# Patient Record
Sex: Male | Born: 1970 | Race: Black or African American | Hispanic: No | Marital: Married | Smoking: Never smoker
Health system: Southern US, Community
[De-identification: ages and names within clinical notes are randomized; demographics above are authoritative.]

## PROBLEM LIST (undated history)

## (undated) DIAGNOSIS — IMO0002 Reserved for concepts with insufficient information to code with codable children: Secondary | ICD-10-CM

## (undated) HISTORY — DX: Reserved for concepts with insufficient information to code with codable children: IMO0002

---

## 2011-01-14 ENCOUNTER — Emergency Department (HOSPITAL_COMMUNITY): Payer: BC Managed Care – PPO

## 2011-01-14 ENCOUNTER — Emergency Department (HOSPITAL_COMMUNITY)
Admission: EM | Admit: 2011-01-14 | Discharge: 2011-01-14 | Disposition: A | Discharge status: Home / Self Care | Payer: BC Managed Care – PPO | Attending: Emergency Medicine | Admitting: Emergency Medicine

## 2011-01-14 DIAGNOSIS — R109 Unspecified abdominal pain: Secondary | ICD-10-CM | POA: Insufficient documentation

## 2011-01-14 DIAGNOSIS — N201 Calculus of ureter: Secondary | ICD-10-CM | POA: Insufficient documentation

## 2011-01-14 DIAGNOSIS — Z87442 Personal history of urinary calculi: Secondary | ICD-10-CM | POA: Insufficient documentation

## 2011-01-14 DIAGNOSIS — M549 Dorsalgia, unspecified: Secondary | ICD-10-CM | POA: Insufficient documentation

## 2011-01-14 DIAGNOSIS — N1330 Unspecified hydronephrosis: Secondary | ICD-10-CM | POA: Insufficient documentation

## 2011-01-14 LAB — URINALYSIS, ROUTINE W REFLEX MICROSCOPIC
Ketones, ur: NEGATIVE mg/dL
Nitrite: NEGATIVE
Specific Gravity, Urine: 1.024 (ref 1.005–1.030)
pH: 7 (ref 5.0–8.0)

## 2011-01-14 LAB — URINE MICROSCOPIC-ADD ON

## 2016-06-20 ENCOUNTER — Encounter: Payer: Self-pay | Admitting: Family Medicine

## 2016-06-20 ENCOUNTER — Encounter (INDEPENDENT_AMBULATORY_CARE_PROVIDER_SITE_OTHER): Payer: Self-pay

## 2016-06-20 ENCOUNTER — Ambulatory Visit (INDEPENDENT_AMBULATORY_CARE_PROVIDER_SITE_OTHER): Payer: BLUE CROSS/BLUE SHIELD | Admitting: Family Medicine

## 2016-06-20 VITALS — BP 112/64 | HR 62 | Temp 98.1°F | Ht 67.0 in | Wt 176.0 lb

## 2016-06-20 DIAGNOSIS — I861 Scrotal varices: Secondary | ICD-10-CM

## 2016-06-20 DIAGNOSIS — N411 Chronic prostatitis: Secondary | ICD-10-CM

## 2016-06-20 DIAGNOSIS — Z0000 Encounter for general adult medical examination without abnormal findings: Secondary | ICD-10-CM

## 2016-06-20 DIAGNOSIS — N410 Acute prostatitis: Secondary | ICD-10-CM

## 2016-06-20 LAB — CBC WITH DIFFERENTIAL/PLATELET
BASOS ABS: 0 10*3/uL (ref 0.0–0.2)
Basos: 0 %
EOS (ABSOLUTE): 0 10*3/uL (ref 0.0–0.4)
Eos: 1 %
Hematocrit: 45.8 % (ref 37.5–51.0)
Hemoglobin: 15.5 g/dL (ref 13.0–17.7)
IMMATURE GRANULOCYTES: 0 %
Immature Grans (Abs): 0 10*3/uL (ref 0.0–0.1)
LYMPHS ABS: 2.3 10*3/uL (ref 0.7–3.1)
Lymphs: 39 %
MCH: 30.6 pg (ref 26.6–33.0)
MCHC: 33.8 g/dL (ref 31.5–35.7)
MCV: 90 fL (ref 79–97)
MONOCYTES: 8 %
Monocytes Absolute: 0.5 10*3/uL (ref 0.1–0.9)
NEUTROS PCT: 52 %
Neutrophils Absolute: 3.1 10*3/uL (ref 1.4–7.0)
Platelets: 213 10*3/uL (ref 150–379)
RBC: 5.07 x10E6/uL (ref 4.14–5.80)
RDW: 13.4 % (ref 12.3–15.4)
WBC: 6 10*3/uL (ref 3.4–10.8)

## 2016-06-20 LAB — URINALYSIS
Bilirubin, UA: NEGATIVE
GLUCOSE, UA: NEGATIVE
Ketones, UA: NEGATIVE
LEUKOCYTES UA: NEGATIVE
Nitrite, UA: NEGATIVE
PROTEIN UA: NEGATIVE
RBC, UA: NEGATIVE
Specific Gravity, UA: 1.02 (ref 1.005–1.030)
Urobilinogen, Ur: 0.2 mg/dL (ref 0.2–1.0)
pH, UA: 6.5 (ref 5.0–7.5)

## 2016-06-20 LAB — CMP14+EGFR
ALK PHOS: 59 IU/L (ref 39–117)
ALT: 16 IU/L (ref 0–44)
AST: 18 IU/L (ref 0–40)
Albumin/Globulin Ratio: 2 (ref 1.2–2.2)
Albumin: 4.3 g/dL (ref 3.5–5.5)
BUN/Creatinine Ratio: 11 (ref 9–20)
BUN: 10 mg/dL (ref 6–24)
Bilirubin Total: 1.1 mg/dL (ref 0.0–1.2)
CALCIUM: 9.3 mg/dL (ref 8.7–10.2)
CO2: 25 mmol/L (ref 18–29)
CREATININE: 0.87 mg/dL (ref 0.76–1.27)
Chloride: 102 mmol/L (ref 96–106)
GFR calc Af Amer: 122 mL/min/{1.73_m2} (ref >59)
GFR calc non Af Amer: 106 mL/min/{1.73_m2} (ref >59)
GLUCOSE: 86 mg/dL (ref 65–99)
Globulin, Total: 2.1 g/dL (ref 1.5–4.5)
Potassium: 4 mmol/L (ref 3.5–5.2)
Sodium: 142 mmol/L (ref 134–144)
Total Protein: 6.4 g/dL (ref 6.0–8.5)

## 2016-06-20 LAB — LIPID PANEL
CHOLESTEROL TOTAL: 201 mg/dL — AB (ref 100–199)
Chol/HDL Ratio: 3.3 ratio units (ref 0.0–5.0)
HDL: 61 mg/dL (ref >39)
LDL CALC: 127 mg/dL — AB (ref 0–99)
TRIGLYCERIDES: 67 mg/dL (ref 0–149)
VLDL CHOLESTEROL CAL: 13 mg/dL (ref 5–40)

## 2016-06-20 MED ORDER — TRAZODONE HCL 150 MG PO TABS: ORAL_TABLET | ORAL | 5 refills | Status: DC

## 2016-06-20 MED ORDER — CIPROFLOXACIN HCL 500 MG PO TABS: 500.0000 mg | ORAL_TABLET | Freq: Two times a day (BID) | ORAL | 0 refills | Status: DC

## 2016-06-20 NOTE — Progress Notes (Signed)
**Note Vincent-Identified via Obfuscation** Subjective:  Patient ID: Vincent Sweeney, male    DOB: 06/06/1970  Age: 46 y.o. MRN: [id number]  CC: New Patient (Initial Visit) (pt here today to establish care, CPE and having trouble sleeping more than 4 hours.)   HPI Vincent Sweeney presents for CPE. Having some scrotal pain. Discomfort, aching. Ongoing for several weeks. Left testicle affected most. Was told in the past he had a varicocele. Repair had been offered, but he delayed. Wants to consider this time   History Vincent Sweeney has a past medical history of Ulcer ([place]).   He has no past surgical history on file.   His family history includes Arthritis in his mother; COPD in his mother; Hypertension in his mother.He reports that he has never smoked. He has never used smokeless tobacco. He reports that he drinks alcohol. He reports that he does not use drugs.    ROS Review of Systems  Constitutional: Negative for chills, diaphoresis, fever and unexpected weight change.  HENT: Negative for congestion, hearing loss, rhinorrhea and sore throat.   Eyes: Negative for visual disturbance.  Respiratory: Negative for cough and shortness of breath.   Cardiovascular: Negative for chest pain.  Gastrointestinal: Negative for abdominal pain, constipation and diarrhea.  Genitourinary: Negative for dysuria and flank pain.  Musculoskeletal: Negative for arthralgias and joint swelling.  Skin: Negative for rash.  Neurological: Negative for dizziness and headaches.  Psychiatric/Behavioral: Negative for dysphoric mood and sleep disturbance.    Objective:  BP 112/64   Pulse 62   Temp 98.1 F (36.7 C) (Oral)   Ht 5' 7"  (1.702 m)   Wt 176 lb (79.8 kg)   BMI 27.57 kg/m   BP Readings from Last 3 Encounters:  06/20/16 112/64    Wt Readings from Last 3 Encounters:  06/20/16 176 lb (79.8 kg)     Physical Exam  Constitutional: He is oriented to person, place, and time. He appears well-developed and well-nourished. No distress.  HENT:  Head:  Normocephalic and atraumatic.  Right Ear: External ear normal.  Left Ear: External ear normal.  Nose: Nose normal.  Mouth/Throat: Oropharynx is clear and moist.  Eyes: Conjunctivae and EOM are normal. Pupils are equal, round, and reactive to light.  Neck: Normal range of motion. Neck supple. No thyromegaly present.  Cardiovascular: Normal rate, regular rhythm and normal heart sounds.   No murmur heard. Pulmonary/Chest: Effort normal and breath sounds normal. No respiratory distress. He has no wheezes. He has no rales.  Abdominal: Soft. Bowel sounds are normal. He exhibits no distension. There is no tenderness. Hernia confirmed negative in the right inguinal area and confirmed negative in the left inguinal area.  Genitourinary: Rectum normal and penis normal. Prostate is tender (& boggy). Right testis shows no mass. Left testis shows tenderness. Left testis shows no mass.  Lymphadenopathy:    He has no cervical adenopathy.       Right: No inguinal adenopathy present.       Left: No inguinal adenopathy present.  Neurological: He is alert and oriented to person, place, and time. He has normal reflexes.  Skin: Skin is warm and dry.  Psychiatric: He has a normal mood and affect. His behavior is normal. Judgment and thought content normal.    Ct Abdomen Pelvis Wo Contrast  Result Date: 01/14/2011 *RADIOLOGY REPORT* Clinical Data: Right flank pain. CT ABDOMEN AND PELVIS WITHOUT CONTRAST Technique:  Multidetector CT imaging of the abdomen and pelvis was performed following the standard protocol without intravenous contrast. Comparison: None.  Findings:  3.2 mm right ureteral vesicle junction stone with mild right-sided hydronephrosis. Unenhanced imaging without focal liver, splenic, renal, adrenal or pancreatic mass.  No calcified gallstones.  No abdominal aortic aneurysm.  No extraluminal bowel inflammatory process.  No free fluid or free air.  No pelvic or abdominal adenopathy.  No bony destructive  lesion. IMPRESSION: 3.2  mm right ureteral vesicle junction stone with mild right-sided hydronephrosis. Original Report Authenticated By: Doug Sou, M.D.   Assessment & Plan:   Vincent Sweeney was seen today for new patient (initial visit).  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 For prostate. Take all of these. -     traZODone (DESYREL) 150 MG tablet; Use from 1/3 to 1 tablet nightly as needed for sleep.      I am having Mr. Appenzeller start on ciprofloxacin and traZODone.  Allergies as of 06/20/2016   No Known Allergies     Medication List       Accurate as of 06/20/16 11:59 PM. Always use your most recent med list.          ciprofloxacin 500 MG tablet Commonly known as:  CIPRO Take 1 tablet (500 mg total) by mouth 2 (two) times daily. For prostate. Take all of these.   traZODone 150 MG tablet Commonly known as:  DESYREL Use from 1/3 to 1 tablet nightly as needed for sleep.        Follow-up: No Follow-up on file.  Claretta Fraise, M.D.

## 2016-06-25 ENCOUNTER — Other Ambulatory Visit: Payer: Self-pay | Admitting: Family Medicine

## 2016-06-25 DIAGNOSIS — N5082 Scrotal pain: Secondary | ICD-10-CM

## 2016-06-30 ENCOUNTER — Other Ambulatory Visit: Payer: Self-pay | Admitting: Family Medicine

## 2016-06-30 DIAGNOSIS — I861 Scrotal varices: Secondary | ICD-10-CM

## 2016-06-30 DIAGNOSIS — N5082 Scrotal pain: Secondary | ICD-10-CM

## 2016-07-01 ENCOUNTER — Other Ambulatory Visit: Payer: Self-pay

## 2016-07-01 ENCOUNTER — Ambulatory Visit (HOSPITAL_COMMUNITY): Payer: Self-pay

## 2016-07-04 ENCOUNTER — Ambulatory Visit (HOSPITAL_BASED_OUTPATIENT_CLINIC_OR_DEPARTMENT_OTHER): Admission: RE | Admit: 2016-07-04 | Payer: BLUE CROSS/BLUE SHIELD | Source: Ambulatory Visit

## 2016-07-04 ENCOUNTER — Ambulatory Visit (HOSPITAL_BASED_OUTPATIENT_CLINIC_OR_DEPARTMENT_OTHER)
Admission: RE | Admit: 2016-07-04 | Discharge: 2016-07-04 | Disposition: A | Discharge status: Home / Self Care | Payer: BLUE CROSS/BLUE SHIELD | Source: Ambulatory Visit | Attending: Family Medicine | Admitting: Family Medicine

## 2016-07-04 ENCOUNTER — Other Ambulatory Visit: Payer: Self-pay | Admitting: Family Medicine

## 2016-07-04 ENCOUNTER — Other Ambulatory Visit (HOSPITAL_BASED_OUTPATIENT_CLINIC_OR_DEPARTMENT_OTHER): Payer: Self-pay

## 2016-07-04 DIAGNOSIS — N433 Hydrocele, unspecified: Secondary | ICD-10-CM | POA: Insufficient documentation

## 2016-07-04 DIAGNOSIS — N4341 Spermatocele of epididymis, single: Secondary | ICD-10-CM | POA: Insufficient documentation

## 2016-07-04 DIAGNOSIS — N5082 Scrotal pain: Secondary | ICD-10-CM | POA: Diagnosis not present

## 2016-07-05 ENCOUNTER — Telehealth: Payer: Self-pay | Admitting: Family Medicine

## 2016-07-05 NOTE — Telephone Encounter (Signed)
Pt is returning call this morning about U/S results this morning. Please advise

## 2016-07-05 NOTE — Telephone Encounter (Signed)
Pt aware of US results. 

## 2016-07-11 ENCOUNTER — Encounter: Payer: Self-pay | Admitting: Family Medicine

## 2016-07-11 ENCOUNTER — Ambulatory Visit (INDEPENDENT_AMBULATORY_CARE_PROVIDER_SITE_OTHER): Payer: BLUE CROSS/BLUE SHIELD | Admitting: Family Medicine

## 2016-07-11 VITALS — BP 105/57 | HR 58 | Temp 97.6°F | Ht 67.0 in | Wt 177.0 lb

## 2016-07-11 DIAGNOSIS — N433 Hydrocele, unspecified: Secondary | ICD-10-CM | POA: Diagnosis not present

## 2016-07-11 DIAGNOSIS — B3789 Other sites of candidiasis: Secondary | ICD-10-CM

## 2016-07-11 DIAGNOSIS — N411 Chronic prostatitis: Secondary | ICD-10-CM | POA: Diagnosis not present

## 2016-07-11 DIAGNOSIS — N410 Acute prostatitis: Secondary | ICD-10-CM | POA: Diagnosis not present

## 2016-07-11 DIAGNOSIS — N4281 Prostatodynia syndrome: Secondary | ICD-10-CM | POA: Diagnosis not present

## 2016-07-11 MED ORDER — ECONAZOLE NITRATE 1 % EX CREA: TOPICAL_CREAM | Freq: Every day | CUTANEOUS | 0 refills | Status: DC

## 2016-07-11 MED ORDER — CIPROFLOXACIN HCL 500 MG PO TABS: 500.0000 mg | ORAL_TABLET | Freq: Two times a day (BID) | ORAL | 0 refills | Status: DC

## 2016-07-11 NOTE — Progress Notes (Signed)
Subjective:  Patient ID: Vincent Sweeney, male    DOB: 10-09-1970  Age: 46 y.o. MRN: [id number]  CC: Follow-up (pt here today stating he is still having discomfort and just finished the cipro.)   HPI Ward Boissonneault presents for persistent pain at groin. Unchanged. Radiates to left testicle. Moderate. Subacute. Ultrasound reviewed. No sign of torsion. He does have hydrocele   History Dagon has a past medical history of Ulcer ([facility]).   He has no past surgical history on file.   His family history includes Arthritis in his mother; COPD in his mother; Hypertension in his mother.He reports that he has never smoked. He has never used smokeless tobacco. He reports that he drinks alcohol. He reports that he does not use drugs.    ROS Review of Systems  Constitutional: Negative for chills, diaphoresis and fever.  HENT: Negative for rhinorrhea and sore throat.   Respiratory: Negative for cough and shortness of breath.   Cardiovascular: Negative for chest pain.  Gastrointestinal: Negative for abdominal pain, nausea and rectal pain.  Genitourinary: Positive for testicular pain. Negative for difficulty urinating, dysuria and flank pain.  Musculoskeletal: Negative for arthralgias and myalgias.  Skin: Negative for rash.       Perianal itching   Neurological: Negative for weakness and headaches.    Objective:  BP (!) 105/57   Pulse (!) 58   Temp 97.6 F (36.4 C) (Oral)   Ht 5\' 7"  (1.702 m)   Wt 177 lb (80.3 kg)   BMI 27.72 kg/m   BP Readings from Last 3 Encounters:  07/11/16 (!) 105/57  06/20/16 112/64    Wt Readings from Last 3 Encounters:  07/11/16 177 lb (80.3 kg)  06/20/16 176 lb (79.8 kg)     Physical Exam  Constitutional: He appears well-developed and well-nourished.  HENT:  Head: Normocephalic and atraumatic.  Right Ear: Tympanic membrane and external ear normal. No decreased hearing is noted.  Left Ear: Tympanic membrane and external ear normal. No decreased  hearing is noted.  Mouth/Throat: No oropharyngeal exudate or posterior oropharyngeal erythema.  Eyes: Pupils are equal, round, and reactive to light.  Neck: Normal range of motion. Neck supple.  Cardiovascular: Normal rate and regular rhythm.   No murmur heard. Pulmonary/Chest: Breath sounds normal. No respiratory distress.  Abdominal: Soft. Bowel sounds are normal. He exhibits no mass. There is no tenderness. Hernia confirmed negative in the right inguinal area and confirmed negative in the left inguinal area.  Genitourinary: Penis normal. Prostate is enlarged and tender. Left testis shows tenderness. Left testis shows no swelling. No penile tenderness.  Genitourinary Comments: 3 cm perianal erythema, slight nodularity with satellite   Vitals reviewed.     Assessment & Plan:   Markos was seen today for follow-up.  Diagnoses and all orders for this visit:  Prostadynia -     Ambulatory referral to Urology  Hydrocele of testis -     Ambulatory referral to Urology  Acute on chronic prostatitis  Candidiasis of anus  Other orders -     econazole nitrate 1 % cream; Apply topically daily. -     ciprofloxacin (CIPRO) 500 MG tablet; Take 1 tablet (500 mg total) by mouth 2 (two) times daily. For prostate. Take all of these.       I am having Mr. Sliker start on econazole nitrate. I am also having him maintain his traZODone and ciprofloxacin.  Allergies as of 07/11/2016   No Known Allergies     Medication  List       Accurate as of 07/11/16 11:59 PM. Always use your most recent med list.          ciprofloxacin 500 MG tablet Commonly known as:  CIPRO Take 1 tablet (500 mg total) by mouth 2 (two) times daily. For prostate. Take all of these.   econazole nitrate 1 % cream Apply topically daily.   traZODone 150 MG tablet Commonly known as:  DESYREL Use from 1/3 to 1 tablet nightly as needed for sleep.        Follow-up: Return in about 6 months (around 01/11/2017), or  if symptoms worsen or fail to improve.  Mechele ClaudeWarren Erline Siddoway, M.D.

## 2017-04-21 ENCOUNTER — Other Ambulatory Visit: Payer: Self-pay | Admitting: Occupational Medicine

## 2017-04-21 ENCOUNTER — Ambulatory Visit: Payer: Self-pay

## 2017-04-21 DIAGNOSIS — M545 Low back pain, unspecified: Secondary | ICD-10-CM

## 2017-04-21 DIAGNOSIS — M25572 Pain in left ankle and joints of left foot: Secondary | ICD-10-CM

## 2017-04-21 DIAGNOSIS — M546 Pain in thoracic spine: Secondary | ICD-10-CM

## 2018-06-23 ENCOUNTER — Encounter: Payer: Self-pay | Admitting: Family Medicine

## 2018-06-23 ENCOUNTER — Ambulatory Visit (INDEPENDENT_AMBULATORY_CARE_PROVIDER_SITE_OTHER): Payer: BLUE CROSS/BLUE SHIELD | Admitting: Family Medicine

## 2018-06-23 VITALS — BP 107/71 | HR 60 | Temp 98.5°F | Ht 67.0 in | Wt 180.0 lb

## 2018-06-23 DIAGNOSIS — M546 Pain in thoracic spine: Secondary | ICD-10-CM

## 2018-06-23 DIAGNOSIS — G8929 Other chronic pain: Secondary | ICD-10-CM

## 2018-06-23 DIAGNOSIS — Z0001 Encounter for general adult medical examination with abnormal findings: Secondary | ICD-10-CM | POA: Diagnosis not present

## 2018-06-23 DIAGNOSIS — N4281 Prostatodynia syndrome: Secondary | ICD-10-CM | POA: Insufficient documentation

## 2018-06-23 DIAGNOSIS — Z0000 Encounter for general adult medical examination without abnormal findings: Secondary | ICD-10-CM

## 2018-06-23 NOTE — Progress Notes (Signed)
Subjective:  Patient ID: Vincent Sweeney, male    DOB: 19-Jun-1970  Age: 48 y.o. MRN: [id number]  CC: Annual Exam   HPI Vincent Sweeney presents for Annual physical. Needs referral for Functional capacity evaluation. This is because of a work related six foot fall off of a platform several months ago. He was given a 10 lb lifting limit at a previous FCE. Overhead lifting  Is prohibited as well.   Depression screen [facility] 2/9 06/23/2018 07/11/2016 06/20/2016  Decreased Interest 0 0 0  Down, Depressed, Hopeless 0 0 0  PHQ - 2 Score 0 0 0    History Kelcey has a past medical history of Ulcer.   He has no past surgical history on file.   His family history includes Arthritis in his mother; COPD in his mother; Hypertension in his mother.He reports that he has never smoked. He has never used smokeless tobacco. He reports current alcohol use. He reports that he does not use drugs.    ROS Review of Systems  Constitutional: Negative for activity change, fatigue and unexpected weight change.  HENT: Negative for congestion, ear pain, hearing loss, postnasal drip and trouble swallowing.   Eyes: Negative for pain and visual disturbance.  Respiratory: Negative for cough, chest tightness and shortness of breath.   Cardiovascular: Negative for chest pain, palpitations and leg swelling.  Gastrointestinal: Negative for abdominal distention, abdominal pain, blood in stool, constipation, diarrhea, nausea and vomiting.  Endocrine: Negative for cold intolerance, heat intolerance and polydipsia.  Genitourinary: Negative for difficulty urinating, dysuria, flank pain, frequency and urgency.  Musculoskeletal: Positive for back pain (left flank region). Negative for arthralgias and joint swelling.  Skin: Negative for color change, rash and wound.  Neurological: Negative for dizziness, syncope, speech difficulty, weakness, light-headedness, numbness and headaches.  Hematological: Does not bruise/bleed easily.    Psychiatric/Behavioral: Negative for confusion, decreased concentration, dysphoric mood and sleep disturbance. The patient is not nervous/anxious.     Objective:  BP 107/71   Pulse 60   Temp 98.5 F (36.9 C) (Oral)   Ht 5' 7"  (1.702 m)   Wt 180 lb (81.6 kg)   BMI 28.19 kg/m   BP Readings from Last 3 Encounters:  06/23/18 107/71  07/11/16 (!) 105/57  06/20/16 112/64    Wt Readings from Last 3 Encounters:  06/23/18 180 lb (81.6 kg)  07/11/16 177 lb (80.3 kg)  06/20/16 176 lb (79.8 kg)     Physical Exam Constitutional:      Appearance: He is well-developed.  HENT:     Head: Normocephalic and atraumatic.  Eyes:     Pupils: Pupils are equal, round, and reactive to light.  Neck:     Musculoskeletal: Normal range of motion.     Thyroid: No thyromegaly.     Trachea: No tracheal deviation.  Cardiovascular:     Rate and Rhythm: Normal rate and regular rhythm.     Heart sounds: Normal heart sounds. No murmur. No friction rub. No gallop.   Pulmonary:     Breath sounds: Normal breath sounds. No wheezing or rales.  Abdominal:     General: Bowel sounds are normal. There is no distension.     Palpations: Abdomen is soft. There is no mass.     Tenderness: There is no abdominal tenderness.     Hernia: There is no hernia in the right inguinal area or left inguinal area.  Genitourinary:    Penis: Normal.      Scrotum/Testes: Normal.  Musculoskeletal:  Normal range of motion.  Lymphadenopathy:     Cervical: No cervical adenopathy.  Skin:    General: Skin is warm and dry.  Neurological:     Mental Status: He is alert and oriented to person, place, and time.       Assessment & Plan:   Naphtali was seen today for annual exam.  Diagnoses and all orders for this visit:  Annual physical exam -     CBC with Differential/Platelet -     CMP14+EGFR -     Lipid panel -     Testosterone,Free and Total -     VITAMIN D 25 Hydroxy (Vit-D Deficiency, Fractures) -     PSA, total  and free -     Urinalysis  Chronic left-sided thoracic back pain -     Ambulatory referral to Occupational Medicine       I have discontinued Jamiah Wakeman's econazole nitrate and ciprofloxacin. I am also having him maintain his traZODone.  Allergies as of 06/23/2018   No Known Allergies     Medication List       Accurate as of June 23, 2018  9:50 PM. Always use your most recent med list.        traZODone 150 MG tablet Commonly known as:  DESYREL Use from 1/3 to 1 tablet nightly as needed for sleep.        Follow-up: Return in about 1 year (around 06/24/2019), or if symptoms worsen or fail to improve.  Claretta Fraise, M.D.

## 2018-06-28 ENCOUNTER — Telehealth: Payer: Self-pay | Admitting: *Deleted

## 2018-06-28 NOTE — Telephone Encounter (Signed)
-----   Message from Mechele Claude, MD sent at 06/28/2018 11:35 AM EST ----- Please call and encourage pt. To return for his lab draw. Thanks, WS ----- Message ----- From: SYSTEM Sent: 06/28/2018  12:04 AM EST To: Mechele Claude, MD

## 2018-06-28 NOTE — Progress Notes (Signed)
Lm-cb 2/24 

## 2018-06-28 NOTE — Telephone Encounter (Signed)
Left message reminding patient to come in and have blood drawn

## 2018-07-13 ENCOUNTER — Telehealth: Payer: Self-pay | Admitting: Family Medicine

## 2018-07-13 NOTE — Telephone Encounter (Signed)
Spoke with pt and advised pt he needs to come in and have his blood work drawn. Pt states he will try to come in one day this week.  Pt also has questions regarding referral, Debbi/Carlon do you know anything about this?

## 2018-07-14 NOTE — Telephone Encounter (Signed)
Yes I called and left him a message that his referral was sent to occupational health at [facility] and I left the phone # so he could call on his VM

## 2018-07-26 ENCOUNTER — Other Ambulatory Visit: Payer: Self-pay

## 2018-07-26 DIAGNOSIS — M546 Pain in thoracic spine: Secondary | ICD-10-CM

## 2018-12-28 ENCOUNTER — Ambulatory Visit (INDEPENDENT_AMBULATORY_CARE_PROVIDER_SITE_OTHER): Payer: BC Managed Care – PPO | Admitting: Nurse Practitioner

## 2018-12-28 ENCOUNTER — Encounter: Payer: Self-pay | Admitting: Nurse Practitioner

## 2018-12-28 DIAGNOSIS — J301 Allergic rhinitis due to pollen: Secondary | ICD-10-CM

## 2018-12-28 MED ORDER — FLUTICASONE PROPIONATE 50 MCG/ACT NA SUSP: 2.0000 | Freq: Every day | NASAL | 6 refills | Status: DC

## 2018-12-28 NOTE — Progress Notes (Signed)
   Virtual Visit via telephone Note Due to COVID-19 pandemic this visit was conducted virtually. This visit type was conducted due to national recommendations for restrictions regarding the COVID-19 Pandemic (e.g. social distancing, sheltering in place) in an effort to limit this patient's exposure and mitigate transmission in our community. All issues noted in this document were discussed and addressed.  A physical exam was not performed with this format.  I connected with Vincent Sweeney on 12/28/18 at 2:35 by telephone and verified that I am speaking with the correct person using two identifiers. Vincent Sweeney is currently located at home and no one is currently with him during visit. The provider, Mary-Margaret Hassell Done, FNP is located in their office at time of visit.  I discussed the limitations, risks, security and privacy concerns of performing an evaluation and management service by telephone and the availability of in person appointments. I also discussed with the patient that there may be a patient responsible charge related to this service. The patient expressed understanding and agreed to proceed.   History and Present Illness:   Chief Complaint: Allergic Rhinitis    HPI Patient calls in c/o sinus congestion and runny nose with slight sore throat.started about 1 week ago. He is having no headache.he has tested covid X2    Review of Systems  Constitutional: Negative for diaphoresis and weight loss.  HENT: Positive for congestion. Negative for sore throat.   Eyes: Negative for blurred vision, double vision and pain.  Respiratory: Negative for cough and shortness of breath.   Cardiovascular: Negative for chest pain, palpitations, orthopnea and leg swelling.  Gastrointestinal: Negative for abdominal pain.  Skin: Negative for rash.  Neurological: Negative for dizziness, sensory change, loss of consciousness, weakness and headaches.  Endo/Heme/Allergies: Negative for polydipsia. Does  not bruise/bleed easily.  Psychiatric/Behavioral: Negative for memory loss. The patient does not have insomnia.   All other systems reviewed and are negative.    Observations/Objective: Alert and oriented- answers all questions appropriately No distress  Assessment and Plan: Vincent Sweeney in today with chief complaint of Allergic Rhinitis    1. Seasonal allergic rhinitis due to pollen Force fluids Rest claritin Meds ordered this encounter  Medications  . fluticasone (FLONASE) 50 MCG/ACT nasal spray    Sig: Place 2 sprays into both nostrils daily.    Dispense:  16 g    Refill:  6    Order Specific Question:   Supervising Provider    Answer:   Caryl Pina A [[id number]]      Follow Up Instructions: prn    I discussed the assessment and treatment plan with the patient. The patient was provided an opportunity to ask questions and all were answered. The patient agreed with the plan and demonstrated an understanding of the instructions.   The patient was advised to call back or seek an in-person evaluation if the symptoms worsen or if the condition fails to improve as anticipated.  The above assessment and management plan was discussed with the patient. The patient verbalized understanding of and has agreed to the management plan. Patient is aware to call the clinic if symptoms persist or worsen. Patient is aware when to return to the clinic for a follow-up visit. Patient educated on when it is appropriate to go to the emergency department.   Time call ended:  2:50  I provided 15 minutes of non-face-to-face time during this encounter.    Mary-Margaret Hassell Done, FNP

## 2019-02-05 IMAGING — DX DG LUMBAR SPINE COMPLETE 4+V
5 series · 5 of 5 positions shown · non-contrast
Comparison: Coronal and sagittal reconstructed images through the
lumbar spine from an abdominal and pelvic CT scan January 14, 2011

CLINICAL DATA: Patient reports a fall yesterday. The patient
reports persistent mid and lower back pain.

EXAM:
LUMBAR SPINE - COMPLETE 4+ VIEW

[l-spine ap]
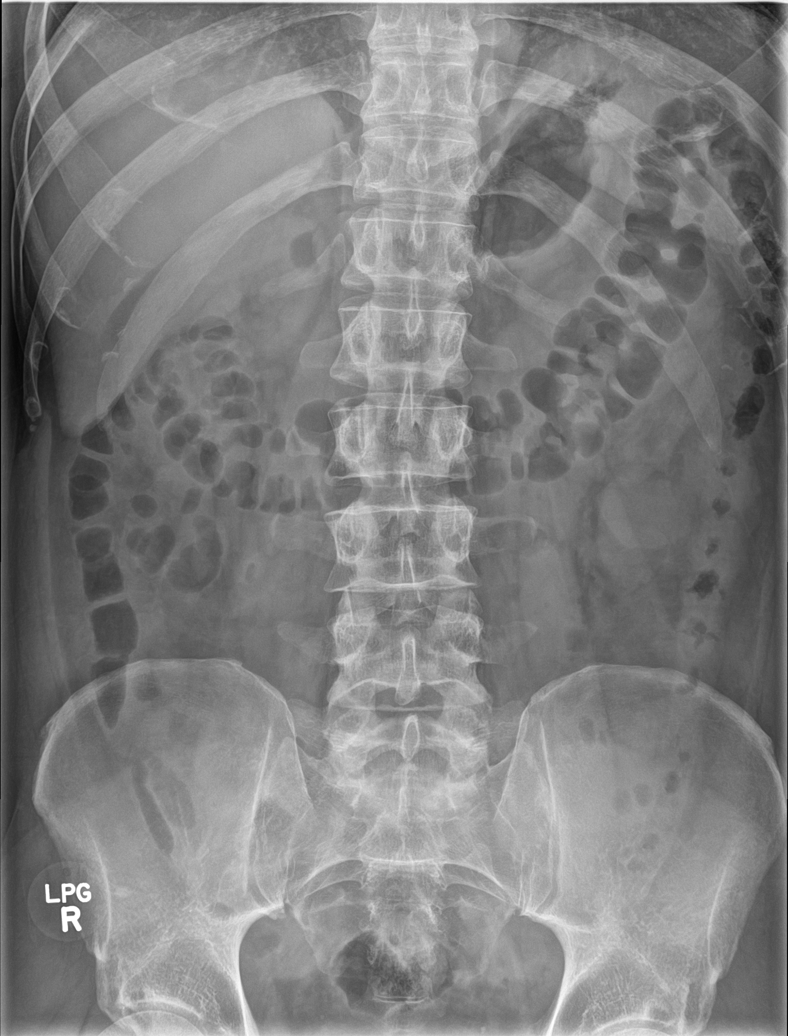

[l-spine obl (1 of 2)]
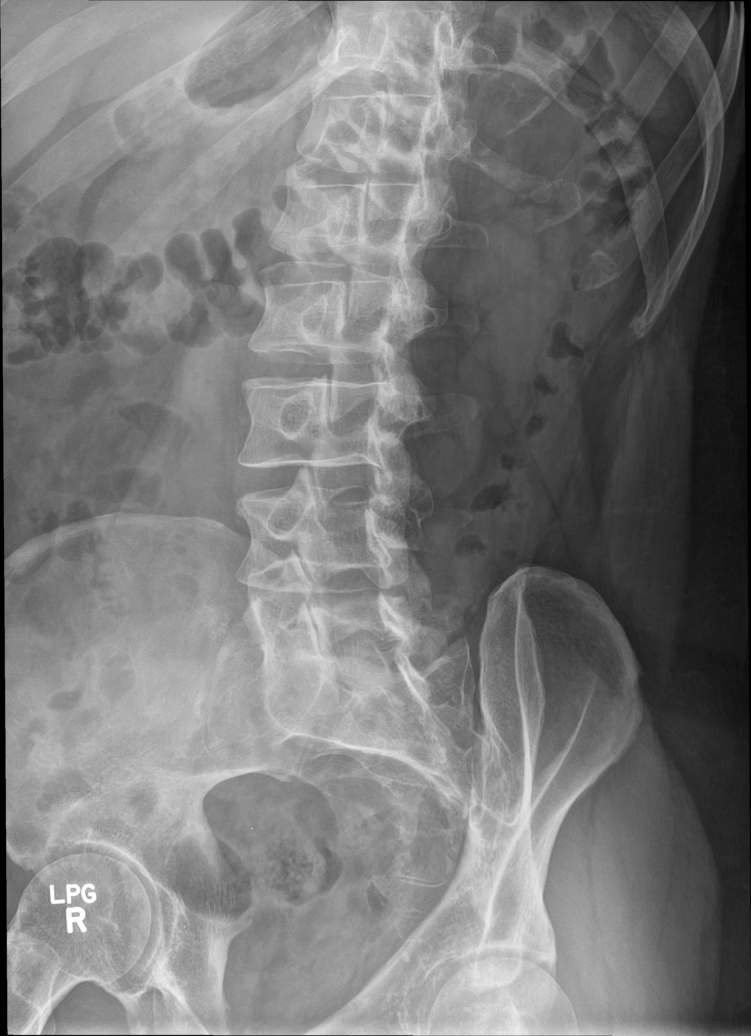

[l-spine obl (2 of 2)]
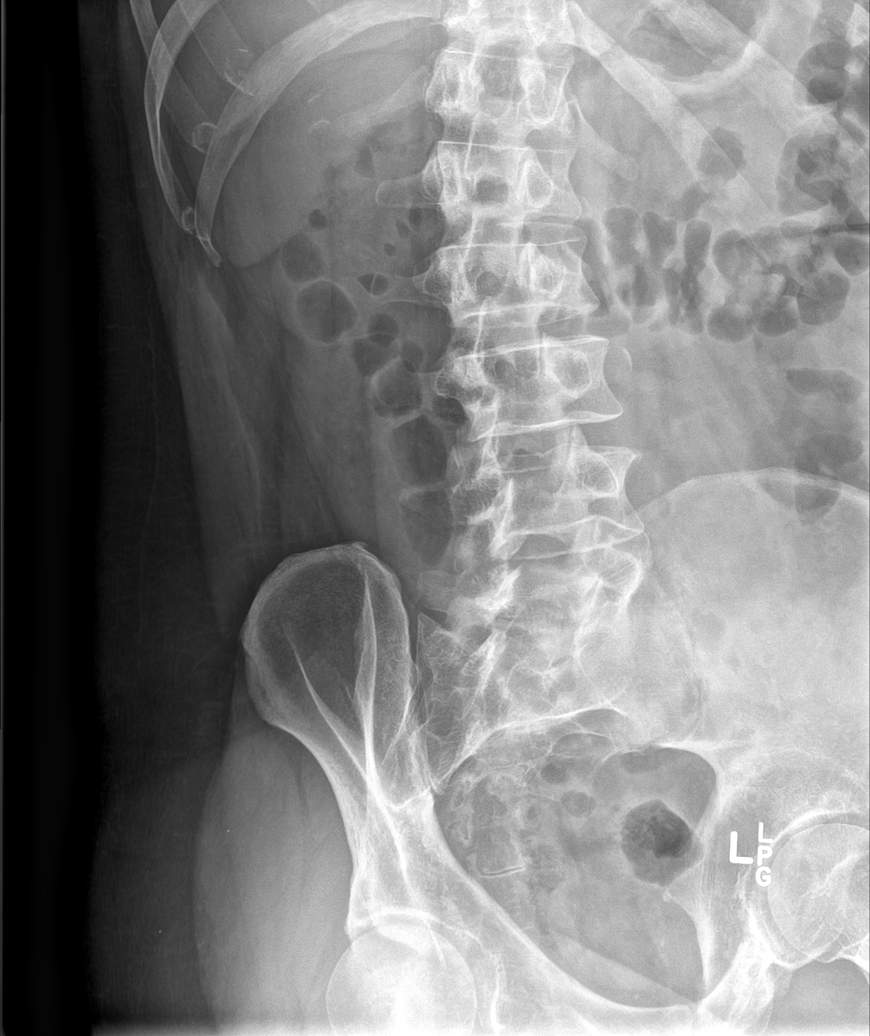

[l-spine lat]
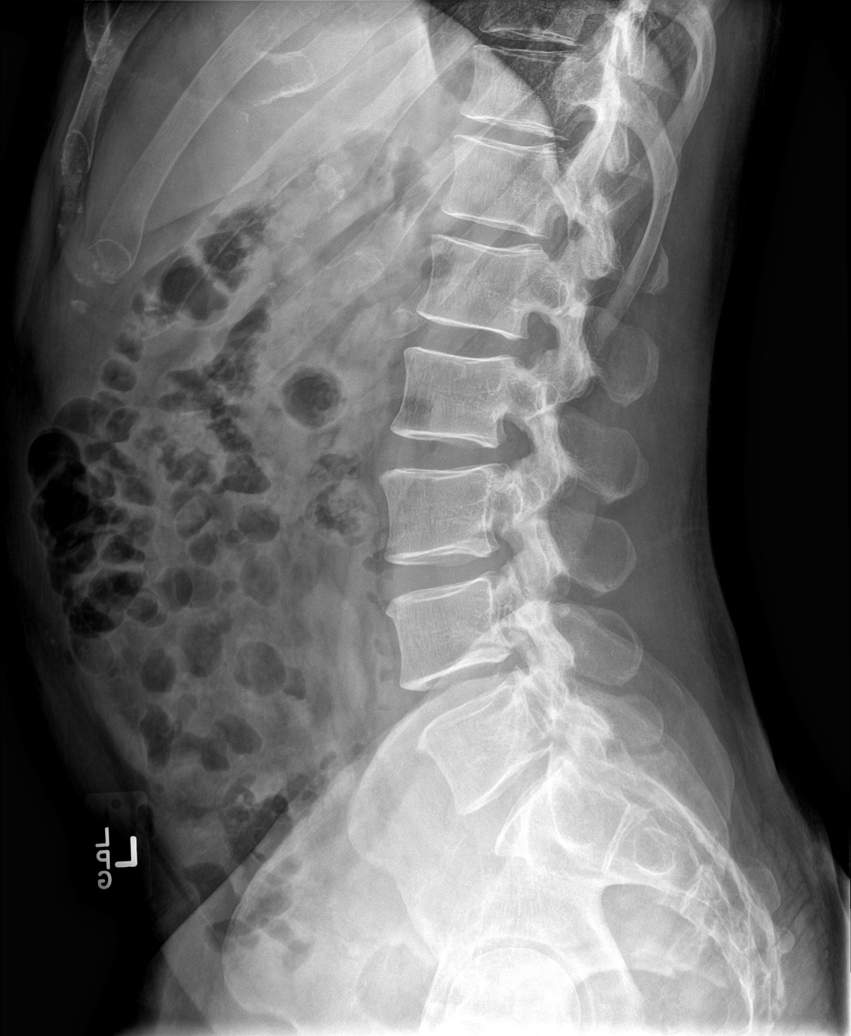

[l-spine spot]
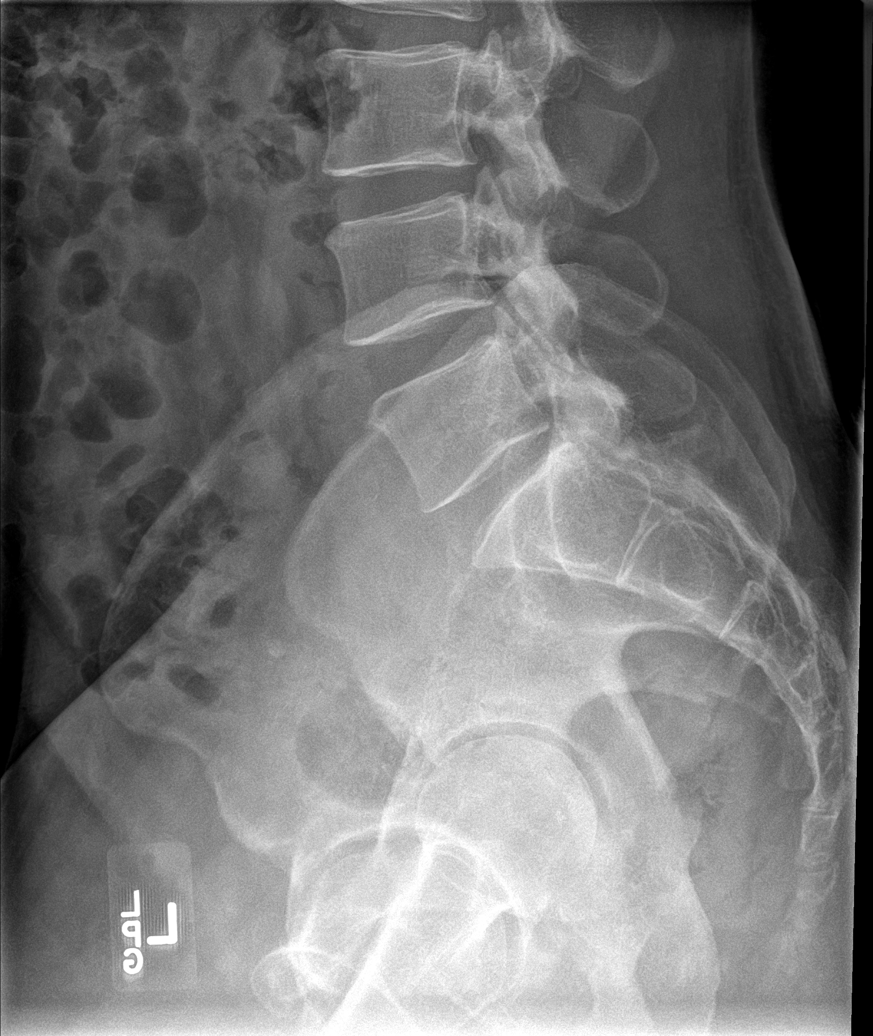

[5 of 5 positions shown; findings below may reference images not displayed]

FINDINGS: The lumbar vertebral bodies are preserved in height. The twelfth
ribs are hypoplastic. The pedicles and transverse processes are
intact. Subtle lucency through the midbody of the left L4 transverse
process is chronic. There is no spondylolisthesis. The disc space
heights are well maintained. There is no significant facet joint
hypertrophy.
IMPRESSION: There is no acute or significant chronic bony abnormality of the
lumbar spine.

## 2020-05-11 ENCOUNTER — Ambulatory Visit: Payer: BC Managed Care – PPO | Admitting: Family Medicine

## 2020-05-15 ENCOUNTER — Telehealth (INDEPENDENT_AMBULATORY_CARE_PROVIDER_SITE_OTHER): Payer: BC Managed Care – PPO | Admitting: Nurse Practitioner

## 2020-05-15 ENCOUNTER — Encounter: Payer: Self-pay | Admitting: Nurse Practitioner

## 2020-05-15 DIAGNOSIS — R42 Dizziness and giddiness: Secondary | ICD-10-CM | POA: Diagnosis not present

## 2020-05-15 DIAGNOSIS — R112 Nausea with vomiting, unspecified: Secondary | ICD-10-CM | POA: Diagnosis not present

## 2020-05-15 MED ORDER — MECLIZINE HCL 25 MG PO TABS: 25.0000 mg | ORAL_TABLET | Freq: Three times a day (TID) | ORAL | 0 refills | Status: DC | PRN

## 2020-05-15 MED ORDER — ONDANSETRON HCL 4 MG PO TABS: 4.0000 mg | ORAL_TABLET | Freq: Three times a day (TID) | ORAL | 0 refills | Status: DC | PRN

## 2020-05-15 NOTE — Progress Notes (Signed)
Virtual Visit via video Note   Due to COVID-19 pandemic this visit was conducted virtually. This visit type was conducted due to national recommendations for restrictions regarding the COVID-19 Pandemic (e.g. social distancing, sheltering in place) in an effort to limit this patient's exposure and mitigate transmission in our community. All issues noted in this document were discussed and addressed.  A physical exam was not performed with this format.  I connected with  Vincent Sweeney  on 05/15/20 at 9:20 by video and verified that I am speaking with the correct person using two identifiers. Vincent Sweeney is currently located at home and his wife is currently with him during visit. The provider, Mary-Margaret Daphine Deutscher, FNP is located in their office at time of visit.  I discussed the limitations, risks, security and privacy concerns of performing an evaluation and management service by video  and the availability of in person appointments. I also discussed with the patient that there may be a patient responsible charge related to this service. The patient expressed understanding and agreed to proceed.   History and Present Illness:   Chief Complaint: Dizziness   HPI Patient and his wife do a video visit this morning. He has been sick for over a month now. He has had 3 con=vid test which were all negative. He has been  To the ER 3x and still doe snot feel better. His wife says they have done no lab work. They did do a CT scan this last visit but I cannot see results in chart in care every where. The last week he has become increasing dizzy with nausea and vomiting. His wife says he cannot keep anything down. He has been given 2 rounds of antibiotics form ER and this last visit they gave him prednisone and an albuterol inhaler. She says that he feels terrible. Has no energy and cannot keep liquids or food down. She said when she took him to the ER in Sunday he was having near syncopal episodes. He  has an apppointment for an in office visit tomorrow at 9AM.   Review of Systems  Constitutional: Positive for malaise/fatigue and weight loss. Negative for chills and fever.  HENT: Positive for congestion.   Respiratory: Negative for cough, sputum production and shortness of breath.   Gastrointestinal: Positive for nausea and vomiting. Negative for abdominal pain, constipation and diarrhea.  Neurological: Positive for dizziness and headaches.  All other systems reviewed and are negative.      Observations/Objective: Alert and oriented- answers all questions appropriately No distress Slightly pale shaky  Assessment and Plan: Vincent Sweeney in today with chief complaint of Dizziness   1. Dizziness - meclizine (ANTIVERT) 25 MG tablet; Take 1 tablet (25 mg total) by mouth 3 (three) times daily as needed for dizziness.  Dispense: 30 tablet; Refill: 0  2. Non-intractable vomiting with nausea, unspecified vomiting type Force fluids when can tolerate [place] when can tolerate Rest Keep appointment with Adin Hector. NP tomorrow morning - ondansetron (ZOFRAN) 4 MG tablet; Take 1 tablet (4 mg total) by mouth every 8 (eight) hours as needed for nausea or vomiting.  Dispense: 20 tablet; Refill: 0   * gave report on him to K. Southern,LPN that will be rooming him tomorrow so she can give report to Adin Hector, NP prior to his visit.  Follow Up Instructions: Keep appointment for tomorrow    I discussed the assessment and treatment plan with the patient. The patient was provided an opportunity to ask  questions and all were answered. The patient agreed with the plan and demonstrated an understanding of the instructions.   The patient was advised to call back or seek an in-person evaluation if the symptoms worsen or if the condition fails to improve as anticipated.  The above assessment and management plan was discussed with the patient. The patient verbalized understanding of and has  agreed to the management plan. Patient is aware to call the clinic if symptoms persist or worsen. Patient is aware when to return to the clinic for a follow-up visit. Patient educated on when it is appropriate to go to the emergency department.   Time call ended: 9:42  I provided 22 minutes of face-to-face time during this encounter.    Mary-Margaret Daphine Deutscher, FNP

## 2020-05-17 ENCOUNTER — Encounter: Payer: Self-pay | Admitting: Family Medicine

## 2020-05-17 ENCOUNTER — Ambulatory Visit (INDEPENDENT_AMBULATORY_CARE_PROVIDER_SITE_OTHER): Payer: BC Managed Care – PPO | Admitting: Family Medicine

## 2020-05-17 ENCOUNTER — Other Ambulatory Visit: Payer: Self-pay

## 2020-05-17 VITALS — BP 119/75 | HR 81 | Temp 98.1°F | Ht 67.0 in | Wt 181.5 lb

## 2020-05-17 DIAGNOSIS — R519 Headache, unspecified: Secondary | ICD-10-CM | POA: Diagnosis not present

## 2020-05-17 DIAGNOSIS — R42 Dizziness and giddiness: Secondary | ICD-10-CM | POA: Diagnosis not present

## 2020-05-17 DIAGNOSIS — J0111 Acute recurrent frontal sinusitis: Secondary | ICD-10-CM

## 2020-05-17 DIAGNOSIS — R112 Nausea with vomiting, unspecified: Secondary | ICD-10-CM | POA: Diagnosis not present

## 2020-05-17 DIAGNOSIS — Z09 Encounter for follow-up examination after completed treatment for conditions other than malignant neoplasm: Secondary | ICD-10-CM

## 2020-05-17 LAB — CBC WITH DIFFERENTIAL/PLATELET
Basophils Absolute: 0 10*3/uL (ref 0.0–0.2)
Basos: 0 %
EOS (ABSOLUTE): 0 10*3/uL (ref 0.0–0.4)
Eos: 0 %
Hematocrit: 47.8 % (ref 37.5–51.0)
Hemoglobin: 16.2 g/dL (ref 13.0–17.7)
Immature Grans (Abs): 0 10*3/uL (ref 0.0–0.1)
Immature Granulocytes: 0 %
Lymphocytes Absolute: 2.1 10*3/uL (ref 0.7–3.1)
Lymphs: 37 %
MCH: 29.7 pg (ref 26.6–33.0)
MCHC: 33.9 g/dL (ref 31.5–35.7)
MCV: 88 fL (ref 79–97)
Monocytes Absolute: 0.5 10*3/uL (ref 0.1–0.9)
Monocytes: 9 %
Neutrophils Absolute: 3 10*3/uL (ref 1.4–7.0)
Neutrophils: 54 %
Platelets: 241 10*3/uL (ref 150–450)
RBC: 5.46 x10E6/uL (ref 4.14–5.80)
RDW: 11.9 % (ref 11.6–15.4)
WBC: 5.7 10*3/uL (ref 3.4–10.8)

## 2020-05-17 LAB — CMP14+EGFR
ALT: 23 IU/L (ref 0–44)
AST: 15 IU/L (ref 0–40)
Albumin/Globulin Ratio: 1.7 (ref 1.2–2.2)
Albumin: 4.5 g/dL (ref 4.0–5.0)
Alkaline Phosphatase: 87 IU/L (ref 44–121)
BUN/Creatinine Ratio: 8 — ABNORMAL LOW (ref 9–20)
BUN: 8 mg/dL (ref 6–24)
Bilirubin Total: 0.6 mg/dL (ref 0.0–1.2)
CO2: 24 mmol/L (ref 20–29)
Calcium: 9.7 mg/dL (ref 8.7–10.2)
Chloride: 101 mmol/L (ref 96–106)
Creatinine, Ser: 0.97 mg/dL (ref 0.76–1.27)
GFR calc Af Amer: 107 mL/min/{1.73_m2} (ref >59)
GFR calc non Af Amer: 93 mL/min/{1.73_m2} (ref >59)
Globulin, Total: 2.6 g/dL (ref 1.5–4.5)
Glucose: 106 mg/dL — ABNORMAL HIGH (ref 65–99)
Potassium: 3.9 mmol/L (ref 3.5–5.2)
Sodium: 141 mmol/L (ref 134–144)
Total Protein: 7.1 g/dL (ref 6.0–8.5)

## 2020-05-17 MED ORDER — AMOXICILLIN-POT CLAVULANATE 875-125 MG PO TABS: 1.0000 | ORAL_TABLET | Freq: Two times a day (BID) | ORAL | 0 refills | Status: AC

## 2020-05-17 NOTE — Progress Notes (Signed)
Established Patient Office Visit  Subjective:  Patient ID: Vincent Sweeney, male    DOB: [birth year]  Age: 50 y.o. MRN: [id number]  CC:  Chief Complaint  Patient presents with  . Dizziness    HPI Vincent Sweeney presents for dizziness for the last 3 weeks. It occurs intermittently. He also has headache, nausea and vomiting as well. It started along with a viral infection and after flying on a plane. He was tested for Covid x 3 and all were negative. He has been on antibiotics for a sinus infection without relief. He continues to have head congestion. He denies abdominal pain, diarrhea, or fever. He is feeling off balance at times. He also reports lightheadedness. The nausea and vomiting has improved over the last few days. He has been able to keep down small amounts of food. He has been able to keep down fluids. He had a CT scan on 05/13/20 that was normal. He has also had a chest Xray that was normal. He reports improvement in headache but it is still very bothersome. It feels like a pressure. He was prescribed meclizine but has not picked it up yet. He has been given prednisone without relief.  He denies shortness of breath, chest pain, blurred vision, numbness, tingling, or one sided weakness.   Past Medical History:  Diagnosis Date  . Ulcer     No past surgical history on file.  Family History  Problem Relation Age of Onset  . Arthritis Mother   . COPD Mother   . Hypertension Mother     Social History   Socioeconomic History  . Marital status: Married    Spouse name: Not on file  . Number of children: Not on file  . Years of education: Not on file  . Highest education level: Not on file  Occupational History  . Not on file  Tobacco Use  . Smoking status: Never Smoker  . Smokeless tobacco: Never Used  Substance and Sexual Activity  . Alcohol use: Yes  . Drug use: No  . Sexual activity: Yes  Other Topics Concern  . Not on file  Social History Narrative  . Not on file    Social Determinants of Health   Financial Resource Strain: Not on file  Food Insecurity: Not on file  Transportation Needs: Not on file  Physical Activity: Not on file  Stress: Not on file  Social Connections: Not on file  Intimate Partner Violence: Not on file    Outpatient Medications Prior to Visit  Medication Sig Dispense Refill  . ondansetron (ZOFRAN) 4 MG tablet Take 1 tablet (4 mg total) by mouth every 8 (eight) hours as needed for nausea or vomiting. 20 tablet 0  . fluticasone (FLONASE) 50 MCG/ACT nasal spray Place 2 sprays into both nostrils daily. (Patient not taking: Reported on 05/17/2020) 16 g 6  . meclizine (ANTIVERT) 25 MG tablet Take 1 tablet (25 mg total) by mouth 3 (three) times daily as needed for dizziness. (Patient not taking: Reported on 05/17/2020) 30 tablet 0  . traZODone (DESYREL) 150 MG tablet Use from 1/3 to 1 tablet nightly as needed for sleep. 30 tablet 5   No facility-administered medications prior to visit.    No Known Allergies  ROS Review of Systems Negative unless specially indicated above in HPI.    Objective:    Physical Exam Vitals and nursing note reviewed.  Constitutional:      General: He is not in acute distress.    Appearance:  Normal appearance. He is not ill-appearing, toxic-appearing or diaphoretic.  HENT:     Head: Normocephalic and atraumatic.     Comments: Tenderness to frontal and maxillary areas.     Right Ear: No drainage, swelling or tenderness. A middle ear effusion is present. No mastoid tenderness. Tympanic membrane is not injected, scarred, perforated, erythematous or bulging.     Left Ear: No drainage, swelling or tenderness. A middle ear effusion is present. No mastoid tenderness. Tympanic membrane is not injected, scarred, perforated, erythematous or bulging.     Nose: Congestion present.     Mouth/Throat:     Mouth: Mucous membranes are moist.     Pharynx: Oropharynx is clear. No oropharyngeal exudate or posterior  oropharyngeal erythema.  Eyes:     Extraocular Movements: Extraocular movements intact.     Conjunctiva/sclera: Conjunctivae normal.     Pupils: Pupils are equal, round, and reactive to light.  Cardiovascular:     Rate and Rhythm: Normal rate and regular rhythm.     Heart sounds: Normal heart sounds. No murmur heard.   Pulmonary:     Effort: Pulmonary effort is normal. No respiratory distress.     Breath sounds: Normal breath sounds. No wheezing.  Abdominal:     General: Bowel sounds are normal. There is no distension.     Palpations: Abdomen is soft.     Tenderness: There is no abdominal tenderness. There is no guarding or rebound.  Musculoskeletal:     Cervical back: Neck supple. No rigidity or tenderness.     Right lower leg: No edema.     Left lower leg: No edema.  Lymphadenopathy:     Cervical: No cervical adenopathy.  Skin:    General: Skin is warm and dry.  Neurological:     General: No focal deficit present.     Mental Status: He is alert and oriented to person, place, and time.     Cranial Nerves: No cranial nerve deficit.     Sensory: No sensory deficit.     Motor: No weakness.     Coordination: Coordination normal.     Gait: Gait normal.  Psychiatric:        Mood and Affect: Mood normal.        Behavior: Behavior normal.        Thought Content: Thought content normal.        Judgment: Judgment normal.     BP 119/75   Pulse 81   Temp 98.1 F (36.7 C) (Temporal)   Ht 5' 7"  (1.702 m)   Wt 181 lb 8 oz (82.3 kg)   BMI 28.43 kg/m  Wt Readings from Last 3 Encounters:  05/17/20 181 lb 8 oz (82.3 kg)  06/23/18 180 lb (81.6 kg)  07/11/16 177 lb (80.3 kg)     Health Maintenance Due  Topic Date Due  . Hepatitis C Screening  Never done  . COVID-19 Vaccine (1) Never done  . HIV Screening  Never done  . TETANUS/TDAP  Never done  . COLONOSCOPY (Pts 45-24yr Insurance coverage will need to be confirmed)  Never done    There are no preventive care reminders  to display for this patient.  No results found for: TSH Lab Results  Component Value Date   WBC 6.0 06/20/2016   HGB 15.5 06/20/2016   HCT 45.8 06/20/2016   MCV 90 06/20/2016   PLT 213 06/20/2016   Lab Results  Component Value Date   NA 142 06/20/2016  K 4.0 06/20/2016   CO2 25 06/20/2016   GLUCOSE 86 06/20/2016   BUN 10 06/20/2016   CREATININE 0.87 06/20/2016   BILITOT 1.1 06/20/2016   ALKPHOS 59 06/20/2016   AST 18 06/20/2016   ALT 16 06/20/2016   PROT 6.4 06/20/2016   ALBUMIN 4.3 06/20/2016   CALCIUM 9.3 06/20/2016   Lab Results  Component Value Date   CHOL 201 (H) 06/20/2016   Lab Results  Component Value Date   HDL 61 06/20/2016   Lab Results  Component Value Date   LDLCALC 127 (H) 06/20/2016   Lab Results  Component Value Date   TRIG 67 06/20/2016   Lab Results  Component Value Date   CHOLHDL 3.3 06/20/2016   No results found for: HGBA1C    Assessment & Plan:   Milik was seen today for dizziness.  Diagnoses and all orders for this visit:  Acute recurrent frontal sinusitis Frontal and maxillary tenderness on exam. Has completed a course of doxycycline. Discussed symptomatic care, flonase, and saline sprays. Augmentin given. Return to office for new or worsening symptoms, or if symptoms persist.  -     amoxicillin-clavulanate (AUGMENTIN) 875-125 MG tablet; Take 1 tablet by mouth 2 (two) times daily for 10 days.  Dizziness ? Vertigo. Will pick up meclizine to try. Normal cardiovascular and neurological exam. Labs pending as below. Return to office for new or worsening symptoms, or if symptoms persist. Discussed possible MRI if nausea/vomiting do not improve vs PT referral for vertigo.  -     CMP14+EGFR -     CBC with Differential/Platelet  Acute nonintractable headache, unspecified headache type Normal neurological exam. Discussed likely due to sinusitis. Discussed sinusitis treatment. Tylenol, advil as needed. Return to office for new or  worsening symptoms, or if symptoms persist.   Non-intractable vomiting with nausea, unspecified vomiting type Improving. Normal abdominal exam. Return to office for new or worsening symptoms, or if symptoms persist.   Hospital discharge follow-up Reviewed hospital records. He has had a normal head CT scan.    Follow-up: Return if symptoms worsen or fail to improve.  The patient indicates understanding of these issues and agrees with the plan.  Gwenlyn Perking, FNP

## 2020-05-17 NOTE — Patient Instructions (Signed)
Vertigo Vertigo is the feeling that you or your surroundings are moving when they are not. This feeling can come and go at any time. Vertigo often goes away on its own. Vertigo can be dangerous if it occurs while you are doing something that could endanger you or others, such as driving or operating machinery. Your health care provider will do tests to determine the cause of your vertigo. Tests will also help your health care provider decide how best to treat your condition. Follow these instructions at home: Eating and drinking  Drink enough fluid to keep your urine pale yellow.  Do not drink alcohol.      Activity  Return to your normal activities as told by your health care provider. Ask your health care provider what activities are safe for you.  In the morning, first sit up on the side of the bed. When you feel okay, stand slowly while you hold onto something until you know that your balance is fine.  Move slowly. Avoid sudden body or head movements or certain positions, as told by your health care provider.  If you have trouble walking or keeping your balance, try using a cane for stability. If you feel dizzy or unstable, sit down right away.  Avoid doing any tasks that would cause danger to you or others if vertigo occurs.  Avoid bending down if you feel dizzy. Place items in your home so that they are easy for you to reach without leaning over.  Do not drive or use heavy machinery if you feel dizzy. General instructions  Take over-the-counter and prescription medicines only as told by your health care provider.  Keep all follow-up visits as told by your health care provider. This is important. Contact a health care provider if:  Your medicines do not relieve your vertigo or they make it worse.  You have a fever.  Your condition gets worse or you develop new symptoms.  Your family or friends notice any behavioral changes.  Your nausea or vomiting gets worse.  You  have numbness or a prickling and tingling sensation in part of your body. Get help right away if you:  Have difficulty moving or speaking.  Are always dizzy.  Faint.  Develop severe headaches.  Have weakness in your hands, arms, or legs.  Have changes in your hearing or vision.  Develop a stiff neck.  Develop sensitivity to light. Summary  Vertigo is the feeling that you or your surroundings are moving when they are not.  Your health care provider will do tests to determine the cause of your vertigo.  Follow instructions for home care. You may be told to avoid certain tasks, positions, or movements.  Contact a health care provider if your medicines do not relieve your symptoms, or if you have a fever, nausea, vomiting, or changes in behavior.  Get help right away if you have severe headaches or difficulty speaking, or you develop hearing or vision problems. This information is not intended to replace advice given to you by your health care provider. Make sure you discuss any questions you have with your health care provider. Document Revised: 03/15/2018 Document Reviewed: 03/15/2018 Elsevier Patient Education  2021 [employer] Sinusitis, Adult Sinusitis is inflammation of your sinuses. Sinuses are hollow spaces in the bones around your face. Your sinuses are located:  Around your eyes.  In the middle of your forehead.  Behind your nose.  In your cheekbones. Mucus normally drains out of your sinuses. When  your nasal tissues become inflamed or swollen, mucus can become trapped or blocked. This allows bacteria, viruses, and fungi to grow, which leads to infection. Most infections of the sinuses are caused by a virus. Sinusitis can develop quickly. It can last for up to 4 weeks (acute) or for more than 12 weeks (chronic). Sinusitis often develops after a cold. What are the causes? This condition is caused by anything that creates swelling in the sinuses or stops mucus from  draining. This includes:  Allergies.  Asthma.  Infection from bacteria or viruses.  Deformities or blockages in your nose or sinuses.  Abnormal growths in the nose (nasal polyps).  Pollutants, such as chemicals or irritants in the air.  Infection from fungi (rare). What increases the risk? You are more likely to develop this condition if you:  Have a weak body defense system (immune system).  Do a lot of swimming or diving.  Overuse nasal sprays.  Smoke. What are the signs or symptoms? The main symptoms of this condition are pain and a feeling of pressure around the affected sinuses. Other symptoms include:  Stuffy nose or congestion.  Thick drainage from your nose.  Swelling and warmth over the affected sinuses.  Headache.  Upper toothache.  A cough that may get worse at night.  Extra mucus that collects in the throat or the back of the nose (postnasal drip).  Decreased sense of smell and taste.  Fatigue.  A fever.  Sore throat.  Bad breath. How is this diagnosed? This condition is diagnosed based on:  Your symptoms.  Your medical history.  A physical exam.  Tests to find out if your condition is acute or chronic. This may include: ? Checking your nose for nasal polyps. ? Viewing your sinuses using a device that has a light (endoscope). ? Testing for allergies or bacteria. ? Imaging tests, such as an MRI or CT scan. In rare cases, a bone biopsy may be done to rule out more serious types of fungal sinus disease. How is this treated? Treatment for sinusitis depends on the cause and whether your condition is chronic or acute.  If caused by a virus, your symptoms should go away on their own within 10 days. You may be given medicines to relieve symptoms. They include: ? Medicines that shrink swollen nasal passages (topical intranasal decongestants). ? Medicines that treat allergies (antihistamines). ? A spray that eases inflammation of the nostrils  (topical intranasal corticosteroids). ? Rinses that help get rid of thick mucus in your nose (nasal saline washes).  If caused by bacteria, your health care provider may recommend waiting to see if your symptoms improve. Most bacterial infections will get better without antibiotic medicine. You may be given antibiotics if you have: ? A severe infection. ? A weak immune system.  If caused by narrow nasal passages or nasal polyps, you may need to have surgery. Follow these instructions at home: Medicines  Take, use, or apply over-the-counter and prescription medicines only as told by your health care provider. These may include nasal sprays.  If you were prescribed an antibiotic medicine, take it as told by your health care provider. Do not stop taking the antibiotic even if you start to feel better. Hydrate and humidify  Drink enough fluid to keep your urine pale yellow. Staying hydrated will help to thin your mucus.  Use a cool mist humidifier to keep the humidity level in your home above 50%.  Inhale steam for 10-15 minutes, 3-4 times  a day, or as told by your health care provider. You can do this in the bathroom while a hot shower is running.  Limit your exposure to cool or dry air.   Rest  Rest as much as possible.  Sleep with your head raised (elevated).  Make sure you get enough sleep each night. General instructions  Apply a warm, moist washcloth to your face 3-4 times a day or as told by your health care provider. This will help with discomfort.  Wash your hands often with soap and water to reduce your exposure to germs. If soap and water are not available, use hand sanitizer.  Do not smoke. Avoid being around people who are smoking (secondhand smoke).  Keep all follow-up visits as told by your health care provider. This is important.   Contact a health care provider if:  You have a fever.  Your symptoms get worse.  Your symptoms do not improve within 10 days. Get  help right away if:  You have a severe headache.  You have persistent vomiting.  You have severe pain or swelling around your face or eyes.  You have vision problems.  You develop confusion.  Your neck is stiff.  You have trouble breathing. Summary  Sinusitis is soreness and inflammation of your sinuses. Sinuses are hollow spaces in the bones around your face.  This condition is caused by nasal tissues that become inflamed or swollen. The swelling traps or blocks the flow of mucus. This allows bacteria, viruses, and fungi to grow, which leads to infection.  If you were prescribed an antibiotic medicine, take it as told by your health care provider. Do not stop taking the antibiotic even if you start to feel better.  Keep all follow-up visits as told by your health care provider. This is important. This information is not intended to replace advice given to you by your health care provider. Make sure you discuss any questions you have with your health care provider. Document Revised: 09/21/2017 Document Reviewed: 09/21/2017 Elsevier Patient Education  2021 [employer].

## 2020-05-28 ENCOUNTER — Telehealth: Payer: Self-pay

## 2020-05-28 NOTE — Telephone Encounter (Signed)
REFERRAL REQUEST Telephone Note  Have you been seen at our office for this problem? Yes (Advise that they may need an appointment with their PCP before a referral can be done)  Reason for Referral: MRI bc his headaches are not resolving Referral discussed with patient: yes Best contact number of patient for referral team:   #[phone] Has patient been seen by a specialist for this issue before: no Patient provider preference for referral: [place] [facility] Patient location preference for referral: [place]   Patient notified that referrals can take up to a week or longer to process. If they haven't heard anything within a week they should call back and speak with the referral department.   Harlow Mares seen pt for this!  Please call pt.

## 2022-11-03 ENCOUNTER — Encounter: Payer: Self-pay | Admitting: Family Medicine

## 2022-11-03 ENCOUNTER — Ambulatory Visit (INDEPENDENT_AMBULATORY_CARE_PROVIDER_SITE_OTHER): Payer: BC Managed Care – PPO | Admitting: Family Medicine

## 2022-11-03 VITALS — BP 95/52 | HR 72 | Temp 97.6°F | Ht 67.0 in | Wt 190.6 lb

## 2022-11-03 DIAGNOSIS — F5101 Primary insomnia: Secondary | ICD-10-CM | POA: Diagnosis not present

## 2022-11-03 DIAGNOSIS — Z0001 Encounter for general adult medical examination with abnormal findings: Secondary | ICD-10-CM | POA: Diagnosis not present

## 2022-11-03 DIAGNOSIS — Z1211 Encounter for screening for malignant neoplasm of colon: Secondary | ICD-10-CM | POA: Diagnosis not present

## 2022-11-03 DIAGNOSIS — Z0000 Encounter for general adult medical examination without abnormal findings: Secondary | ICD-10-CM

## 2022-11-03 MED ORDER — TRAZODONE HCL 150 MG PO TABS: ORAL_TABLET | ORAL | 5 refills | Status: DC

## 2022-11-03 MED ORDER — TAMSULOSIN HCL 0.4 MG PO CAPS: 0.8000 mg | ORAL_CAPSULE | Freq: Every day | ORAL | 3 refills | Status: AC

## 2022-11-03 NOTE — Progress Notes (Signed)
Complete physical exam  Patient: Vincent Sweeney   DOB: 1971-04-27   52 y.o. Male  MRN: [id number]  Subjective:    Chief Complaint  Patient presents with   Annual Exam    Vincent Sweeney is a 52 y.o. male who presents today for a complete physical exam. He reports consuming a general diet.  He generally feels well. He reports sleeping poorly. He does not have additional problems to discuss today.    Most recent fall risk assessment:    11/03/2022    3:04 PM  Fall Risk   Falls in the past year? 0     Most recent depression screenings:    11/03/2022    3:04 PM 06/23/2018   10:48 AM  PHQ 2/9 Scores  PHQ - 2 Score 0 0        Patient Care Team: Mechele Claude, MD as PCP - General (Family Medicine)   Outpatient Medications Prior to Visit  Medication Sig   [DISCONTINUED] fluticasone (FLONASE) 50 MCG/ACT nasal spray Place 2 sprays into both nostrils daily. (Patient not taking: Reported on 05/17/2020)   [DISCONTINUED] meclizine (ANTIVERT) 25 MG tablet Take 1 tablet (25 mg total) by mouth 3 (three) times daily as needed for dizziness. (Patient not taking: Reported on 05/17/2020)   [DISCONTINUED] ondansetron (ZOFRAN) 4 MG tablet Take 1 tablet (4 mg total) by mouth every 8 (eight) hours as needed for nausea or vomiting.   No facility-administered medications prior to visit.    Review of Systems  Constitutional:  Negative for chills, diaphoresis, fever, malaise/fatigue and weight loss.  HENT:  Negative for congestion, ear pain, hearing loss, nosebleeds, sore throat and tinnitus.   Eyes:  Negative for blurred vision, double vision, photophobia, pain, discharge and redness.  Respiratory:  Negative for cough, hemoptysis, sputum production, shortness of breath and wheezing.   Cardiovascular:  Negative for chest pain, palpitations, orthopnea, leg swelling and PND.  Gastrointestinal:  Negative for abdominal pain, blood in stool, constipation, diarrhea, heartburn, melena, nausea and vomiting.   Genitourinary:  Positive for frequency (sometimes feeels urge & can't go. Other times goes, and has to go back in 15 min to go again). Negative for dysuria, flank pain, hematuria and urgency.  Musculoskeletal:  Negative for back pain, falls, myalgias and neck pain.  Skin:  Negative for itching and rash.  Neurological:  Negative for dizziness, tingling, tremors, sensory change, speech change, focal weakness, seizures, loss of consciousness, weakness and headaches.  Endo/Heme/Allergies:  Negative for environmental allergies and polydipsia. Does not bruise/bleed easily.  Psychiatric/Behavioral:  Negative for depression, hallucinations, memory loss, substance abuse and suicidal ideas. The patient has insomnia (not sleeping much. Goes to sleep, but awake after 4 hours and can't go back.). The patient is not nervous/anxious.           Objective:     BP (!) 95/52   Pulse 72   Temp 97.6 F (36.4 C)   Ht 5\' 7"  (1.702 m)   Wt 190 lb 9.6 oz (86.5 kg)   SpO2 97%   BMI 29.85 kg/m  BP Readings from Last 3 Encounters:  11/03/22 (!) 95/52  05/17/20 119/75  06/23/18 107/71   Wt Readings from Last 3 Encounters:  11/03/22 190 lb 9.6 oz (86.5 kg)  05/17/20 181 lb 8 oz (82.3 kg)  06/23/18 180 lb (81.6 kg)      Physical Exam Constitutional:      Appearance: He is well-developed.  HENT:     Head: Normocephalic and  atraumatic.  Eyes:     Pupils: Pupils are equal, round, and reactive to light.  Neck:     Thyroid: No thyromegaly.     Trachea: No tracheal deviation.  Cardiovascular:     Rate and Rhythm: Normal rate and regular rhythm.     Heart sounds: Normal heart sounds. No murmur heard.    No friction rub. No gallop.  Pulmonary:     Breath sounds: Normal breath sounds. No wheezing or rales.  Abdominal:     General: Bowel sounds are normal. There is no distension.     Palpations: Abdomen is soft. There is no mass.     Tenderness: There is no abdominal tenderness.     Hernia: There is  no hernia in the left inguinal area.  Genitourinary:    Penis: Normal.      Testes: Normal.  Musculoskeletal:        General: Normal range of motion.     Cervical back: Normal range of motion.  Lymphadenopathy:     Cervical: No cervical adenopathy.  Skin:    General: Skin is warm and dry.  Neurological:     Mental Status: He is alert and oriented to person, place, and time.      No results found for any visits on 11/03/22.     Assessment & Plan:    Routine Health Maintenance and Physical Exam   There is no immunization history on file for this patient.  Health Maintenance  Topic Date Due   DTaP/Tdap/Td (1 - Tdap) Never done   Colonoscopy  Never done   COVID-19 Vaccine (1) 11/19/2022 (Originally 04/03/1971)   Zoster Vaccines- Shingrix (1 of 2) 02/03/2023 (Originally 10/03/2022)   Hepatitis C Screening  11/03/2023 (Originally 10/03/1990)   HIV Screening  11/03/2023 (Originally 10/03/1987)   INFLUENZA VACCINE  12/04/2022   HPV VACCINES  Aged Out    Discussed health benefits of physical activity, and encouraged him to engage in regular exercise appropriate for his age and condition.  Problem List Items Addressed This Visit   None Visit Diagnoses     Well adult exam    -  Primary   Relevant Orders   CBC with Differential/Platelet   CMP14+EGFR   Lipid panel   Urinalysis   PSA, total and free   Primary insomnia       Screen for colon cancer       Relevant Orders   Ambulatory referral to Gastroenterology      Return in about 1 year (around 11/03/2023), or if symptoms worsen or fail to improve, for [employer] physical.     Mechele Claude, MD

## 2022-11-05 ENCOUNTER — Other Ambulatory Visit: Payer: BC Managed Care – PPO

## 2022-11-05 LAB — CBC WITH DIFFERENTIAL/PLATELET
Basos: 0 %
Immature Granulocytes: 0 %
MCV: 89 fL (ref 79–97)
RDW: 12.6 % (ref 11.6–15.4)

## 2022-11-05 LAB — URINALYSIS
Bilirubin, UA: NEGATIVE
Glucose, UA: NEGATIVE
Ketones, UA: NEGATIVE
Leukocytes,UA: NEGATIVE
Nitrite, UA: NEGATIVE
Protein,UA: NEGATIVE
Specific Gravity, UA: 1.02 (ref 1.005–1.030)
Urobilinogen, Ur: 0.2 mg/dL (ref 0.2–1.0)
pH, UA: 7 (ref 5.0–7.5)

## 2022-11-05 LAB — CMP14+EGFR

## 2022-11-05 LAB — LIPID PANEL

## 2022-11-05 LAB — PSA, TOTAL AND FREE

## 2022-11-06 LAB — CMP14+EGFR
ALT: 15 IU/L (ref 0–44)
Albumin: 4.4 g/dL (ref 4.1–5.1)
Alkaline Phosphatase: 74 IU/L (ref 44–121)
BUN/Creatinine Ratio: 7 — ABNORMAL LOW (ref 9–20)
BUN: 8 mg/dL (ref 6–24)
CO2: 23 mmol/L (ref 20–29)
Calcium: 9.6 mg/dL (ref 8.7–10.2)
Chloride: 104 mmol/L (ref 96–106)
Creatinine, Ser: 1.09 mg/dL (ref 0.76–1.27)
Glucose: 95 mg/dL (ref 70–99)
Potassium: 3.9 mmol/L (ref 3.5–5.2)
Sodium: 143 mmol/L (ref 134–144)
Total Protein: 6.8 g/dL (ref 6.0–8.5)
eGFR: 83 mL/min/{1.73_m2} (ref >59)

## 2022-11-06 LAB — CBC WITH DIFFERENTIAL/PLATELET
Basophils Absolute: 0 10*3/uL (ref 0.0–0.2)
EOS (ABSOLUTE): 0 10*3/uL (ref 0.0–0.4)
Eos: 1 %
Hematocrit: 47.6 % (ref 37.5–51.0)
Hemoglobin: 16.2 g/dL (ref 13.0–17.7)
Immature Grans (Abs): 0 10*3/uL (ref 0.0–0.1)
Lymphocytes Absolute: 2.4 10*3/uL (ref 0.7–3.1)
Lymphs: 46 %
MCH: 30.1 pg (ref 26.6–33.0)
MCHC: 34 g/dL (ref 31.5–35.7)
Monocytes Absolute: 0.4 10*3/uL (ref 0.1–0.9)
Monocytes: 7 %
Neutrophils Absolute: 2.3 10*3/uL (ref 1.4–7.0)
Neutrophils: 46 %
Platelets: 213 10*3/uL (ref 150–450)
RBC: 5.38 x10E6/uL (ref 4.14–5.80)
WBC: 5.1 10*3/uL (ref 3.4–10.8)

## 2022-11-06 LAB — LIPID PANEL
Cholesterol, Total: 217 mg/dL — ABNORMAL HIGH (ref 100–199)
LDL Chol Calc (NIH): 146 mg/dL — ABNORMAL HIGH (ref 0–99)
Triglycerides: 93 mg/dL (ref 0–149)
VLDL Cholesterol Cal: 17 mg/dL (ref 5–40)

## 2022-11-06 LAB — PSA, TOTAL AND FREE
PSA, Free Pct: 53.3 %
Prostate Specific Ag, Serum: 0.6 ng/mL (ref 0.0–4.0)

## 2022-11-11 ENCOUNTER — Other Ambulatory Visit: Payer: Self-pay | Admitting: Family Medicine

## 2022-11-11 MED ORDER — ROSUVASTATIN CALCIUM 10 MG PO TABS: 10.0000 mg | ORAL_TABLET | Freq: Every day | ORAL | 1 refills | Status: DC

## 2022-11-26 ENCOUNTER — Other Ambulatory Visit: Payer: Self-pay | Admitting: Family Medicine

## 2023-05-13 ENCOUNTER — Encounter: Payer: Self-pay | Admitting: *Deleted

## 2023-05-29 ENCOUNTER — Encounter: Payer: Self-pay | Admitting: Family Medicine

## 2023-05-29 ENCOUNTER — Ambulatory Visit (INDEPENDENT_AMBULATORY_CARE_PROVIDER_SITE_OTHER): Payer: BC Managed Care – PPO | Admitting: Family Medicine

## 2023-05-29 VITALS — BP 117/65 | HR 74 | Temp 97.6°F | Ht 67.0 in | Wt 192.0 lb

## 2023-05-29 DIAGNOSIS — J0100 Acute maxillary sinusitis, unspecified: Secondary | ICD-10-CM

## 2023-05-29 DIAGNOSIS — R1033 Periumbilical pain: Secondary | ICD-10-CM

## 2023-05-29 DIAGNOSIS — K429 Umbilical hernia without obstruction or gangrene: Secondary | ICD-10-CM | POA: Diagnosis not present

## 2023-05-29 MED ORDER — SIMETHICONE 125 MG PO CAPS: 125.0000 mg | ORAL_CAPSULE | Freq: Three times a day (TID) | ORAL | 2 refills | Status: AC

## 2023-05-29 MED ORDER — AMOXICILLIN-POT CLAVULANATE 875-125 MG PO TABS: 1.0000 | ORAL_TABLET | Freq: Two times a day (BID) | ORAL | 0 refills | Status: DC

## 2023-05-29 NOTE — Progress Notes (Signed)
Subjective:  Patient ID: Vincent Sweeney, male    DOB: 1970-12-10  Age: 53 y.o. MRN: [id number]  CC: Abdominal Pain (On and off for several months. Middle lower abdomin. Eating or getting too full. Normal bowel movements. No nausea or vomiting. ) and Sinus Problem (Sinus drainage, pressure and headache. Clear mucous. Taking OTC meds.)   HPI Vincent Sweeney presents for abdominal pain X 3-4 months. Constant. 1-3/10 ache. Nml Bms 1-2 a day. No vomiting or nausea. Appetite is good. No fever.  Symptoms include congestion, facial pain, nasal congestion, non productive cough, post nasal drip and sinus pressure. There is no fever, chills, or sweats. Onset of symptoms was a few days ago, gradually worsening since that time.       11/03/2022    3:04 PM 06/23/2018   10:48 AM 07/11/2016    9:54 AM  Depression screen PHQ 2/9  Decreased Interest 0 0 0  Down, Depressed, Hopeless 0 0 0  PHQ - 2 Score 0 0 0    History Vincent Sweeney has a past medical history of Ulcer.   Vincent Sweeney has no past surgical history on file.   Vincent Sweeney family history includes Arthritis in Vincent Sweeney mother; COPD in Vincent Sweeney mother; Hypertension in Vincent Sweeney mother.Vincent Sweeney reports that Vincent Sweeney has never smoked. Vincent Sweeney has never used smokeless tobacco. Vincent Sweeney reports current alcohol use. Vincent Sweeney reports that Vincent Sweeney does not use drugs.    ROS Review of Systems  Constitutional:  Negative for activity change, appetite change, chills and fever.  HENT:  Positive for congestion, postnasal drip, rhinorrhea and sinus pressure. Negative for ear discharge, ear pain, hearing loss, nosebleeds, sneezing and trouble swallowing.   Respiratory:  Negative for chest tightness and shortness of breath.   Cardiovascular:  Negative for chest pain and palpitations.  Gastrointestinal:  Positive for abdominal distention.  Musculoskeletal:  Negative for arthralgias.  Skin:  Negative for rash.    Objective:  BP 117/65   Pulse 74   Temp 97.6 F (36.4 C)   Ht 5\' 7"  (1.702 m)   Wt 192 lb (87.1 kg)   SpO2  95%   BMI 30.07 kg/m   BP Readings from Last 3 Encounters:  05/29/23 117/65  11/03/22 (!) 95/52  05/17/20 119/75    Wt Readings from Last 3 Encounters:  05/29/23 192 lb (87.1 kg)  11/03/22 190 lb 9.6 oz (86.5 kg)  05/17/20 181 lb 8 oz (82.3 kg)     Physical Exam Vitals reviewed.  Constitutional:      Appearance: Vincent Sweeney is well-developed.  HENT:     Head: Normocephalic and atraumatic.     Right Ear: External ear normal.     Left Ear: External ear normal.     Mouth/Throat:     Pharynx: No oropharyngeal exudate or posterior oropharyngeal erythema.  Eyes:     Pupils: Pupils are equal, round, and reactive to light.  Cardiovascular:     Rate and Rhythm: Normal rate and regular rhythm.     Heart sounds: No murmur heard. Pulmonary:     Effort: No respiratory distress.     Breath sounds: Normal breath sounds.  Abdominal:     Tenderness: There is abdominal tenderness. There is no guarding or rebound.     Hernia: A hernia is present. Hernia is present in the umbilical area (tender, painful with reduction).  Musculoskeletal:     Cervical back: Normal range of motion and neck supple.  Neurological:     Mental Status: Vincent Sweeney is alert and oriented to  person, place, and time.       Assessment & Plan:   Vincent Sweeney was seen today for abdominal pain and sinus problem.  Diagnoses and all orders for this visit:  Periumbilical abdominal pain -     CBC with Differential/Platelet -     CMP14+EGFR -     Amylase -     Lipase  Umbilical hernia without obstruction and without gangrene  Acute maxillary sinusitis, recurrence not specified  Other orders -     Simethicone (GAS-X EXTRA STRENGTH) 125 MG CAPS; Take 1 capsule (125 mg total) by mouth 4 (four) times daily -  before meals and at bedtime. -     amoxicillin-clavulanate (AUGMENTIN) 875-125 MG tablet; Take 1 tablet by mouth 2 (two) times daily. Take all of this medication       I am having Vincent Sweeney start on Simethicone and  amoxicillin-clavulanate. I am also having Vincent Sweeney maintain Vincent Sweeney tamsulosin, rosuvastatin, and traZODone.  Allergies as of 05/29/2023   No Known Allergies      Medication List        Accurate as of May 29, 2023  9:56 PM. If you have any questions, ask your nurse or doctor.          amoxicillin-clavulanate 875-125 MG tablet Commonly known as: AUGMENTIN Take 1 tablet by mouth 2 (two) times daily. Take all of this medication Started by: Lavaughn Haberle   rosuvastatin 10 MG tablet Commonly known as: Crestor Take 1 tablet (10 mg total) by mouth daily. For cholesterol   Simethicone 125 MG Caps Commonly known as: Gas-X Extra Strength Take 1 capsule (125 mg total) by mouth 4 (four) times daily -  before meals and at bedtime. Started by: Broadus John Jacky Dross   tamsulosin 0.4 MG Caps capsule Commonly known as: FLOMAX Take 2 capsules (0.8 mg total) by mouth at bedtime. For urine flow and prostate   traZODone 150 MG tablet Commonly known as: DESYREL USE FROM 1/3 TO 1 TABLET NIGHTLY AS NEEDED FOR SLEEP.         Follow-up: Return if symptoms worsen or fail to improve.  Mechele Claude, M.D.

## 2023-05-30 LAB — CBC WITH DIFFERENTIAL/PLATELET
Basophils Absolute: 0 10*3/uL (ref 0.0–0.2)
Basos: 0 %
EOS (ABSOLUTE): 0 10*3/uL (ref 0.0–0.4)
Eos: 1 %
Hematocrit: 44.3 % (ref 37.5–51.0)
Hemoglobin: 14.9 g/dL (ref 13.0–17.7)
Immature Grans (Abs): 0 10*3/uL (ref 0.0–0.1)
Immature Granulocytes: 0 %
Lymphocytes Absolute: 2 10*3/uL (ref 0.7–3.1)
Lymphs: 37 %
MCH: 29.8 pg (ref 26.6–33.0)
MCHC: 33.6 g/dL (ref 31.5–35.7)
MCV: 89 fL (ref 79–97)
Monocytes Absolute: 0.5 10*3/uL (ref 0.1–0.9)
Monocytes: 8 %
Neutrophils Absolute: 3 10*3/uL (ref 1.4–7.0)
Neutrophils: 54 %
Platelets: 211 10*3/uL (ref 150–450)
RBC: 5 x10E6/uL (ref 4.14–5.80)
RDW: 12.3 % (ref 11.6–15.4)
WBC: 5.6 10*3/uL (ref 3.4–10.8)

## 2023-05-30 LAB — CMP14+EGFR
ALT: 11 IU/L (ref 0–44)
AST: 14 IU/L (ref 0–40)
Albumin: 4.3 g/dL (ref 4.1–5.1)
Alkaline Phosphatase: 69 IU/L (ref 44–121)
BUN/Creatinine Ratio: 12 (ref 9–20)
BUN: 11 mg/dL (ref 6–24)
Bilirubin Total: 0.6 mg/dL (ref 0.0–1.2)
CO2: 27 mmol/L (ref 20–29)
Calcium: 9.6 mg/dL (ref 8.7–10.2)
Chloride: 104 mmol/L (ref 96–106)
Creatinine, Ser: 0.93 mg/dL (ref 0.76–1.27)
Globulin, Total: 1.9 g/dL (ref 1.5–4.5)
Glucose: 81 mg/dL (ref 70–99)
Potassium: 4.2 mmol/L (ref 3.5–5.2)
Sodium: 143 mmol/L (ref 134–144)
Total Protein: 6.2 g/dL (ref 6.0–8.5)
eGFR: 100 mL/min/{1.73_m2} (ref >59)

## 2023-05-30 LAB — LIPASE: Lipase: 82 U/L — ABNORMAL HIGH (ref 13–78)

## 2023-05-30 LAB — AMYLASE: Amylase: 145 U/L — ABNORMAL HIGH (ref 31–110)

## 2023-06-01 ENCOUNTER — Other Ambulatory Visit: Payer: Self-pay | Admitting: Family Medicine

## 2023-06-01 ENCOUNTER — Telehealth: Payer: Self-pay | Admitting: Family Medicine

## 2023-06-01 DIAGNOSIS — R1033 Periumbilical pain: Secondary | ICD-10-CM

## 2023-06-01 DIAGNOSIS — R748 Abnormal levels of other serum enzymes: Secondary | ICD-10-CM

## 2023-06-01 NOTE — Telephone Encounter (Signed)
Copied from CRM [id number]. Topic: Clinical - Request for Lab/Test Order >> Jun 01, 2023  3:55 PM Desma Mcgregor wrote: Reason for CRM: Wife Zella Ball called in and stated that the Abdomen Ultrasound scheduled at [facility] for tomorrow has to be done through [facility]. She is going to have them contact the office for the orders. She is going to contact [facility] ultrasound dept and cancel the appt.

## 2023-06-02 ENCOUNTER — Ambulatory Visit (HOSPITAL_COMMUNITY)
Admission: RE | Admit: 2023-06-02 | Discharge: 2023-06-02 | Disposition: A | Discharge status: Home / Self Care | Payer: BC Managed Care – PPO | Source: Ambulatory Visit | Attending: Family Medicine | Admitting: Family Medicine

## 2023-06-02 ENCOUNTER — Other Ambulatory Visit (HOSPITAL_COMMUNITY): Payer: Self-pay | Admitting: Family Medicine

## 2023-06-02 DIAGNOSIS — R748 Abnormal levels of other serum enzymes: Secondary | ICD-10-CM | POA: Diagnosis present

## 2023-06-02 DIAGNOSIS — R1033 Periumbilical pain: Secondary | ICD-10-CM

## 2023-06-02 DIAGNOSIS — K7689 Other specified diseases of liver: Secondary | ICD-10-CM

## 2023-06-03 ENCOUNTER — Telehealth: Payer: Self-pay

## 2023-06-03 NOTE — Telephone Encounter (Signed)
Order faxed.

## 2023-06-03 NOTE — Telephone Encounter (Signed)
Copied from CRM [id number]. Topic: Referral - Question >> Jun 03, 2023 12:09 PM Carlatta H wrote: Reason for CRM: Called to request order for CT be Faxed  to [phone] //

## 2023-06-04 ENCOUNTER — Telehealth: Payer: Self-pay | Admitting: Family Medicine

## 2023-06-04 NOTE — Telephone Encounter (Signed)
Called pt he is not aware of this. It may be due to a sonogram that [facility] ordered.

## 2023-06-05 ENCOUNTER — Telehealth: Payer: Self-pay | Admitting: Family Medicine

## 2023-06-05 NOTE — Telephone Encounter (Unsigned)
Copied from CRM [id number]. Topic: Clinical - Lab/Test Results >> Jun 05, 2023  9:30 AM Geroge Baseman wrote: Reason for CRM: [facility], [place], calling to see if patient CT or ultrasound, seems like they have been sent order for both. [phone] can speak with anyone.

## 2023-06-08 ENCOUNTER — Telehealth: Payer: Self-pay

## 2023-06-08 ENCOUNTER — Telehealth: Payer: Self-pay | Admitting: Family Medicine

## 2023-06-08 NOTE — Telephone Encounter (Signed)
Copied from CRM [id number]. Topic: Clinical - Request for Lab/Test Order >> Jun 08, 2023 11:36 AM Ivette P wrote: Reason for CRM: [facility] is requesting the CT order to be sent over, [facility] has not received order to be able to schedule pt.    [facility], Cheral Almas [phone] can speak with anyone  Pt is requesting callback once completed. [phone]

## 2023-06-08 NOTE — Telephone Encounter (Signed)
Copied from CRM [id number]. Topic: Clinical - Request for Lab/Test Order >> Jun 08, 2023  3:22 PM Dennison Nancy wrote: Reason for CRM: [place] with  [facility] checking on status order with doctor signature  for ct abdomen with/without attention to liver  Received the order however the order do not have the doctor's signature  Please refax [phone]  can just send the page showing the sign doctor's signature

## 2023-06-08 NOTE — Telephone Encounter (Signed)
Can we confirm whether this Patient needs to have [place] or CT Done? They Department has received both orders. If it is decided that Patient needs a CT - I will have to see if insurance will approve first before an order can be faxed. Either way the department needs a signed order faxed to them.   Please Advise.

## 2023-06-09 ENCOUNTER — Telehealth: Payer: Self-pay | Admitting: Family Medicine

## 2023-06-09 NOTE — Telephone Encounter (Unsigned)
Copied from CRM [id number]. Topic: Clinical - Lab/Test Results >> Jun 09, 2023  9:07 AM Monisha R wrote: Reason for CRM:  Dionora from [facility] called to see about update of PCP signature

## 2023-06-09 NOTE — Telephone Encounter (Signed)
All his is rather odd since the ultrasound was completed a week ago. In the report a CT was recommended and was as a result ORDERED by Ms. Rakes in my absence.

## 2023-06-09 NOTE — Telephone Encounter (Signed)
Order refaxed with provider signature

## 2023-06-09 NOTE — Telephone Encounter (Signed)
[place] already completed and resulted

## 2023-06-09 NOTE — Telephone Encounter (Signed)
Called to clarify they just need to CT order to becalled in again. It was missing provider signature. Please send in order with provider sig. Thank you.

## 2023-07-23 ENCOUNTER — Ambulatory Visit: Payer: BC Managed Care – PPO | Admitting: Family Medicine

## 2023-07-23 ENCOUNTER — Encounter: Payer: Self-pay | Admitting: Family Medicine

## 2023-07-23 VITALS — BP 120/70 | HR 75 | Temp 97.6°F | Ht 67.0 in | Wt 192.8 lb

## 2023-07-23 DIAGNOSIS — E782 Mixed hyperlipidemia: Secondary | ICD-10-CM | POA: Diagnosis not present

## 2023-07-23 MED ORDER — ROSUVASTATIN CALCIUM 10 MG PO TABS: 10.0000 mg | ORAL_TABLET | Freq: Every day | ORAL | 1 refills | Status: AC

## 2023-07-23 MED ORDER — TRAZODONE HCL 150 MG PO TABS
ORAL_TABLET | ORAL | 1 refills | Status: AC
Start: 2023-07-23 — End: ?

## 2023-07-23 NOTE — Progress Notes (Signed)
 Subjective:  Patient ID: Vincent Sweeney, male    DOB: 1971/03/11  Age: 53 y.o. MRN: [id number]  CC: ct (Follow up on ct) and Hospitalization Follow-up Larey Seat and hit head on Sunday and seen in hospital. Pt brought paperwork with him. )   HPI Hebert Dooling presents for follow-up of elevated cholesterol. Doing well without complaints on current medication. Denies side effects of statin including myalgia and arthralgia and nausea. Also in today for liver function testing. Currently no chest pain, shortness of breath or other cardiovascular related symptoms noted.  Sleeping better with trazodone. Tamsulsing helping with urine flow.  History Aldrin has a past medical history of Ulcer.   He has no past surgical history on file.   His family history includes Arthritis in his mother; COPD in his mother; Hypertension in his mother.He reports that he has never smoked. He has never used smokeless tobacco. He reports current alcohol use. He reports that he does not use drugs.  Current Outpatient Medications on File Prior to Visit  Medication Sig Dispense Refill   rosuvastatin (CRESTOR) 10 MG tablet Take 1 tablet (10 mg total) by mouth daily. For cholesterol 90 tablet 1   Simethicone (GAS-X EXTRA STRENGTH) 125 MG CAPS Take 1 capsule (125 mg total) by mouth 4 (four) times daily -  before meals and at bedtime. 120 capsule 2   tamsulosin (FLOMAX) 0.4 MG CAPS capsule Take 2 capsules (0.8 mg total) by mouth at bedtime. For urine flow and prostate 180 capsule 3   traZODone (DESYREL) 150 MG tablet USE FROM 1/3 TO 1 TABLET NIGHTLY AS NEEDED FOR SLEEP. 90 tablet 1   No current facility-administered medications on file prior to visit.    ROS Review of Systems  Constitutional: Negative.   HENT: Negative.    Eyes:  Negative for visual disturbance.  Respiratory:  Negative for cough and shortness of breath.   Cardiovascular:  Negative for chest pain and leg swelling.  Gastrointestinal:  Negative for  abdominal pain, diarrhea, nausea and vomiting.  Genitourinary:  Negative for difficulty urinating and frequency.  Musculoskeletal:  Negative for arthralgias and myalgias.  Skin:  Negative for rash.  Neurological:  Negative for headaches.  Psychiatric/Behavioral:  Negative for sleep disturbance.     Objective:  BP 120/70   Pulse 75   Temp 97.6 F (36.4 C)   Ht 5\' 7"  (1.702 m)   Wt 192 lb 12.8 oz (87.5 kg)   SpO2 95%   BMI 30.20 kg/m   BP Readings from Last 3 Encounters:  07/23/23 120/70  05/29/23 117/65  11/03/22 (!) 95/52    Wt Readings from Last 3 Encounters:  07/23/23 192 lb 12.8 oz (87.5 kg)  05/29/23 192 lb (87.1 kg)  11/03/22 190 lb 9.6 oz (86.5 kg)     Physical Exam Vitals reviewed.  Constitutional:      Appearance: He is well-developed.  HENT:     Head: Normocephalic and atraumatic.     Right Ear: External ear normal.     Left Ear: External ear normal.     Mouth/Throat:     Pharynx: No oropharyngeal exudate or posterior oropharyngeal erythema.  Eyes:     Pupils: Pupils are equal, round, and reactive to light.  Cardiovascular:     Rate and Rhythm: Normal rate and regular rhythm.     Heart sounds: No murmur heard. Pulmonary:     Effort: No respiratory distress.     Breath sounds: Normal breath sounds.  Musculoskeletal:  Cervical back: Normal range of motion and neck supple.  Neurological:     Mental Status: He is alert and oriented to person, place, and time.     No results found for: "HGBA1C"  Lab Results  Component Value Date   WBC 5.6 05/29/2023   HGB 14.9 05/29/2023   HCT 44.3 05/29/2023   PLT 211 05/29/2023   GLUCOSE 81 05/29/2023   CHOL 217 (H) 11/05/2022   TRIG 93 11/05/2022   HDL 54 11/05/2022   LDLCALC 146 (H) 11/05/2022   ALT 11 05/29/2023   AST 14 05/29/2023   NA 143 05/29/2023   K 4.2 05/29/2023   CL 104 05/29/2023   CREATININE 0.93 05/29/2023   BUN 11 05/29/2023   CO2 27 05/29/2023    US Abdomen Complete Result  Date: 06/02/2023 CLINICAL DATA:  Diffuse pain EXAM: ABDOMEN ULTRASOUND COMPLETE COMPARISON:  CT 01/14/2011 FINDINGS: Gallbladder: No gallstones or wall thickening visualized. No sonographic Murphy sign noted by sonographer. Common bile duct: Diameter: Normal caliber, 2 mm Liver: Complex cystic area measures 1.8 x 1.8 x 1.6 cm, favor minimally complex cyst. No suspicious hepatic abnormality or biliary ductal dilatation. Portal vein is patent on color Doppler imaging with normal direction of blood flow towards the liver. IVC: No abnormality visualized. Pancreas: Visualized portion unremarkable. Spleen: Size and appearance within normal limits. Right Kidney: Length: 11.3 cm. Echogenicity within normal limits. No mass or hydronephrosis visualized. Left Kidney: Length: 11.5 cm. Echogenicity within normal limits. No mass or hydronephrosis visualized. Abdominal aorta: No aneurysm visualized. Other findings: None. IMPRESSION: No acute findings in the abdomen or pelvis. 1.8 cm septated cyst in the liver, favor benign complex cyst. This could be confirmed with CT with IV contrast if felt clinically indicated. Electronically Signed   By: Charlett Nose M.D.   On: 06/02/2023 12:37    Assessment & Plan:  There are no diagnoses linked to this encounter.  I have discontinued Gedalia Oliveto's amoxicillin-clavulanate. I am also having him maintain his tamsulosin, rosuvastatin, traZODone, and Simethicone.    Follow-up: No follow-ups on file.  Mechele Claude, M.D.

## 2023-07-27 ENCOUNTER — Encounter: Payer: Self-pay | Admitting: Family Medicine

## 2023-11-04 ENCOUNTER — Encounter: Payer: BC Managed Care – PPO | Admitting: Family Medicine

## 2023-11-12 ENCOUNTER — Encounter: Payer: BC Managed Care – PPO | Admitting: Family Medicine

## 2023-11-12 ENCOUNTER — Encounter: Payer: Self-pay | Admitting: Family Medicine

## 2023-11-25 ENCOUNTER — Ambulatory Visit: Payer: Self-pay | Admitting: *Deleted

## 2023-11-25 NOTE — Telephone Encounter (Signed)
 Called to check up on pt but had to lm on vm. Pt has apt for tomorrow. LS

## 2023-11-25 NOTE — Telephone Encounter (Signed)
 FYI Only or Action Required?: FYI only for provider.  Patient was last seen in primary care on 07/23/2023 by Zollie Lowers, MD.  Called Nurse Triage reporting Loss of Consciousness.  Symptoms began today.  Interventions attempted: Nothing.  Symptoms are: unknown- patient has called wife from work.  Triage Disposition: See HCP Within 4 Hours (Or PCP Triage)  Patient/caregiver understands and will follow disposition?: ED advised for evaluation- not sure patient will go- appointment has been scheduled with PCP_ wife will update office.   Reason for Disposition  [1] All other patients AND [2] now alert and feels fine  (Exception: SIMPLE FAINT due to stress, pain, prolonged standing, or suddenly standing)  Answer Assessment - Initial Assessment Questions Patient's wife is calling to report patient fainted at work- does not have a lot of information. This is second episode- March. Advised with unknown factors- patient needs to be seen- ED/UC- appointment was made for follow up- patient may refuse ED. Wife will call office with update- she is going to encourage above.  1. ONSET: How long were you unconscious? (e.g., minutes, seconds) When did it happen?     Today- pass out and woke 2. CONTENT: What happened during the period of unconsciousness? (e.g., seizure activity)      Patient was at work 3. MENTAL STATUS: Alert and oriented now? (e.g., oriented x 3 = name, month, location)      Seems alert after- called wife 4. TRIGGER: What do you think caused the fainting? What were you doing just before you fainted?  (e.g., exercise, sudden standing up, prolonged standing)     unsure 5. RECURRENT SYMPTOM: Have you ever passed out before? If Yes, ask: When was the last time? and What happened that time?      Second time- March- patient was at home in shower- EMS was called 6. INJURY: Did you hurt yourself when you fell?      No injury  Protocols used: Fainting-A-AH   Copied  from CRM #[id number]. Topic: Clinical - Red Word Triage >> Nov 25, 2023 10:18 AM Rosaria BRAVO wrote: Red Word that prompted transfer to Nurse Triage: Pt fainted, no injuries sustained today. Pt's wife is calling, not currently with pt.

## 2023-11-25 NOTE — Telephone Encounter (Signed)
 I agree. Needs to be seen in E.D.

## 2023-11-26 ENCOUNTER — Encounter: Payer: Self-pay | Admitting: Family Medicine

## 2023-11-26 ENCOUNTER — Ambulatory Visit: Admitting: Family Medicine

## 2024-04-04 ENCOUNTER — Telehealth: Payer: Self-pay

## 2024-04-04 NOTE — Telephone Encounter (Signed)
 Copied from CRM #[id number]. Topic: Referral - Request for Referral >> Apr 04, 2024  1:14 PM Everette C wrote: Did the patient discuss referral with their provider in the last year? No (If No - schedule appointment) (If Yes - send message)  Appointment offered? No  Type of order/referral and detailed reason for visit: Neck and Spine specialist / Imaging or neck and spine   Preference of office, provider, location: patient has no preference but would like to be seen locally   If referral order, have you been seen by this specialty before? No (If Yes, this issue or another issue? When? Where?  Can we respond through MyChart? No

## 2024-04-19 ENCOUNTER — Ambulatory Visit: Admitting: Family Medicine
